# Patient Record
Sex: Female | Born: 1970 | Race: White | Hispanic: Yes | Marital: Single | Smoking: Never smoker
Health system: Southern US, Community
[De-identification: ages and names within clinical notes are randomized; demographics above are authoritative.]

## PROBLEM LIST (undated history)

## (undated) DIAGNOSIS — R748 Abnormal levels of other serum enzymes: Secondary | ICD-10-CM

## (undated) DIAGNOSIS — O24419 Gestational diabetes mellitus in pregnancy, unspecified control: Secondary | ICD-10-CM

## (undated) DIAGNOSIS — F32A Depression, unspecified: Secondary | ICD-10-CM

## (undated) DIAGNOSIS — F329 Major depressive disorder, single episode, unspecified: Secondary | ICD-10-CM

## (undated) HISTORY — DX: Abnormal levels of other serum enzymes: R74.8

---

## 2005-12-11 ENCOUNTER — Ambulatory Visit: Payer: Self-pay | Admitting: Internal Medicine

## 2005-12-12 ENCOUNTER — Ambulatory Visit: Payer: Self-pay | Admitting: *Deleted

## 2006-01-11 ENCOUNTER — Encounter: Payer: Self-pay | Admitting: Internal Medicine

## 2006-01-11 ENCOUNTER — Ambulatory Visit: Payer: Self-pay | Admitting: Internal Medicine

## 2007-01-24 ENCOUNTER — Inpatient Hospital Stay (HOSPITAL_COMMUNITY): Admission: AD | Admit: 2007-01-24 | Discharge: 2007-01-27 | Payer: Self-pay | Admitting: Obstetrics

## 2007-05-22 ENCOUNTER — Ambulatory Visit: Payer: Self-pay | Admitting: Internal Medicine

## 2007-05-22 LAB — CONVERTED CEMR LAB
Alkaline Phosphatase: 108 units/L (ref 39–117)
CO2: 22 meq/L (ref 19–32)
Cholesterol: 170 mg/dL (ref 0–200)
Creatinine, Ser: 0.48 mg/dL (ref 0.40–1.20)
Glucose, Bld: 97 mg/dL (ref 70–99)
LDL Cholesterol: 100 mg/dL — ABNORMAL HIGH (ref 0–99)
Sodium: 139 meq/L (ref 135–145)
Total Bilirubin: 0.5 mg/dL (ref 0.3–1.2)
Total CHOL/HDL Ratio: 4.4
Triglycerides: 154 mg/dL — ABNORMAL HIGH (ref <150)
VLDL: 31 mg/dL (ref 0–40)

## 2007-07-08 ENCOUNTER — Ambulatory Visit: Payer: Self-pay | Admitting: Internal Medicine

## 2007-08-21 ENCOUNTER — Encounter: Payer: Self-pay | Admitting: Family Medicine

## 2007-08-21 ENCOUNTER — Ambulatory Visit: Payer: Self-pay | Admitting: Internal Medicine

## 2007-08-21 LAB — CONVERTED CEMR LAB
Cholesterol: 157 mg/dL (ref 0–200)
HDL: 38 mg/dL — ABNORMAL LOW (ref >39)
Hep B E Ab: NEGATIVE
Total CHOL/HDL Ratio: 4.1
Triglycerides: 185 mg/dL — ABNORMAL HIGH (ref <150)
VLDL: 37 mg/dL (ref 0–40)

## 2007-09-25 ENCOUNTER — Ambulatory Visit: Payer: Self-pay | Admitting: Internal Medicine

## 2007-11-20 ENCOUNTER — Ambulatory Visit: Payer: Self-pay | Admitting: Family Medicine

## 2007-11-21 ENCOUNTER — Encounter: Payer: Self-pay | Admitting: Internal Medicine

## 2008-11-25 ENCOUNTER — Ambulatory Visit: Payer: Self-pay | Admitting: Internal Medicine

## 2008-11-25 ENCOUNTER — Encounter: Payer: Self-pay | Admitting: Family Medicine

## 2009-08-12 ENCOUNTER — Ambulatory Visit: Payer: Self-pay | Admitting: Internal Medicine

## 2009-08-12 LAB — CONVERTED CEMR LAB
Albumin: 4.5 g/dL (ref 3.5–5.2)
Alkaline Phosphatase: 81 units/L (ref 39–117)
CO2: 20 meq/L (ref 19–32)
Calcium: 9.2 mg/dL (ref 8.4–10.5)
Chloride: 103 meq/L (ref 96–112)
Glucose, Bld: 81 mg/dL (ref 70–99)
LDL Cholesterol: 92 mg/dL (ref 0–99)
Potassium: 4 meq/L (ref 3.5–5.3)
Sodium: 137 meq/L (ref 135–145)
Total Protein: 7.7 g/dL (ref 6.0–8.3)
Triglycerides: 136 mg/dL (ref <150)

## 2009-09-02 ENCOUNTER — Ambulatory Visit: Payer: Self-pay | Admitting: Internal Medicine

## 2009-10-05 ENCOUNTER — Ambulatory Visit: Payer: Self-pay | Admitting: Internal Medicine

## 2010-08-15 ENCOUNTER — Other Ambulatory Visit: Payer: Self-pay | Admitting: Family Medicine

## 2010-08-15 DIAGNOSIS — Z0374 Encounter for suspected problem with fetal growth ruled out: Secondary | ICD-10-CM

## 2010-08-15 DIAGNOSIS — Z1389 Encounter for screening for other disorder: Secondary | ICD-10-CM

## 2010-08-15 LAB — RUBELLA ANTIBODY, IGM: Rubella: IMMUNE

## 2010-08-15 LAB — ABO/RH: RH Type: POSITIVE

## 2010-08-15 LAB — HIV ANTIBODY (ROUTINE TESTING W REFLEX): HIV: NONREACTIVE

## 2010-08-18 ENCOUNTER — Ambulatory Visit (HOSPITAL_COMMUNITY)
Admission: RE | Admit: 2010-08-18 | Discharge: 2010-08-18 | Disposition: A | Discharge status: Home / Self Care | Payer: Medicaid Other | Source: Ambulatory Visit | Attending: Family Medicine | Admitting: Family Medicine

## 2010-08-18 DIAGNOSIS — O09529 Supervision of elderly multigravida, unspecified trimester: Secondary | ICD-10-CM | POA: Insufficient documentation

## 2010-08-18 DIAGNOSIS — Z363 Encounter for antenatal screening for malformations: Secondary | ICD-10-CM | POA: Insufficient documentation

## 2010-08-18 DIAGNOSIS — O358XX0 Maternal care for other (suspected) fetal abnormality and damage, not applicable or unspecified: Secondary | ICD-10-CM | POA: Insufficient documentation

## 2010-08-18 DIAGNOSIS — Z1389 Encounter for screening for other disorder: Secondary | ICD-10-CM | POA: Insufficient documentation

## 2010-08-18 DIAGNOSIS — Z0374 Encounter for suspected problem with fetal growth ruled out: Secondary | ICD-10-CM

## 2010-08-18 DIAGNOSIS — O0930 Supervision of pregnancy with insufficient antenatal care, unspecified trimester: Secondary | ICD-10-CM | POA: Insufficient documentation

## 2010-09-13 NOTE — Op Note (Signed)
Roberta Reed, Roberta Reed NO.:  [id number]   MEDICAL RECORD NO.:  [id number]          PATIENT TYPE:  INP   LOCATION:  9199                          FACILITY:  [facility]   PHYSICIAN:  Kathreen Cosier, M.D.DATE OF BIRTH:  04/17/1971   DATE OF PROCEDURE:  01/24/2007  DATE OF DISCHARGE:                               OPERATIVE REPORT   PREOPERATIVE DIAGNOSIS:  Intrauterine pregnancy at term, in early labor,  with a frank breech presentation.   SURGEON:  Dr. Gaynell Face.   ANESTHESIA:  Spinal.   PROCEDURE:  The patient placed on the operating table in the supine  position after spinal administered by Dr. Jean Rosenthal.  Transverse  suprapubic incision made, carried down through rectus fascia.  Fascia  cleaned and incised the length of the incision.  Recti muscles retracted  laterally.  Peritoneum incised longitudinally.  Transverse incision made  in the visceral peritoneum above the bladder.  Bladder mobilized  inferiorly.  Transverse lower uterine incision made.  The patient  delivered over frank breech female, Apgar 8 and 9, weighing 8 pounds 1  ounce.  The team was in attendance.  The fluid was clear.  The placenta  was fundal and removed manually and sent to labor and delivery.  Uterine  cavity cleaned with dry laps.  Uterine incision closed in 1 layer with  continuous suture of No. 1 chromic.  Bladder flap reattached with 2-0  chromic.  Uterus well contracted.  Tubes and ovaries normal.  Abdomen  closed in layers.  Peritoneum continuous suture of 0 chromic.  Fascia  continuous suture of 0 Dexon.  Skin closed with subcuticular stitch of 4-  0 Monocryl.  Blood loss 600 mL.  Patient tolerated the procedure well,  taken to recovery room in good condition.           ______________________________  Kathreen Cosier, M.D.     BAM/MEDQ  D:  01/24/2007  T:  01/25/2007  Job:  [id number]

## 2010-09-16 NOTE — Discharge Summary (Signed)
Roberta Reed, Roberta Reed NO.:  [id number]   MEDICAL RECORD NO.:  [id number]          PATIENT TYPE:  INP   LOCATION:  9148                          FACILITY:  [facility]   PHYSICIAN:  Kathreen Cosier, M.D.DATE OF BIRTH:  04/17/1971   DATE OF ADMISSION:  01/24/2007  DATE OF DISCHARGE:  01/27/2007                               DISCHARGE SUMMARY   Patient is a 41 year old primigravida, [facility] January 31, 2007, who was  admitted in early labor.  Cervix was 3 cm with a breech at -3 station  and she was delivered via low transverse cesarean section because of  breech presentation and labor.  She had a female, Apgars 8 and 9, weighing  8 pounds 1 ounce.  The placenta was sent to labor and delivery.  Postoperatively, she did well.  On admission, her hemoglobin was 13.4,  platelets 179.  Postoperatively, 10.2 hemoglobin, 154 platelets.  RPR  negative, HIV negative, urine negative.   She did well and was discharged on the third postoperative day,  ambulatory, on a regular diet, to see me in six weeks.   DISCHARGE DIAGNOSES:  Status post primary low transverse cesarean  section at term, because of breech presentation and labor.           ______________________________  Kathreen Cosier, M.D.     BAM/MEDQ  D:  02/20/2007  T:  02/20/2007  Job:  [id number]

## 2010-12-13 ENCOUNTER — Inpatient Hospital Stay (HOSPITAL_COMMUNITY)
Admission: AD | Admit: 2010-12-13 | Discharge: 2010-12-17 | DRG: 766 | Disposition: A | Discharge status: Home / Self Care | Payer: Medicaid Other | Source: Ambulatory Visit | Attending: Obstetrics & Gynecology | Admitting: Obstetrics & Gynecology

## 2010-12-13 ENCOUNTER — Encounter: Payer: Self-pay | Admitting: Family Medicine

## 2010-12-13 ENCOUNTER — Inpatient Hospital Stay (HOSPITAL_COMMUNITY): Payer: Medicaid Other

## 2010-12-13 DIAGNOSIS — O09529 Supervision of elderly multigravida, unspecified trimester: Secondary | ICD-10-CM | POA: Diagnosis present

## 2010-12-13 DIAGNOSIS — O34219 Maternal care for unspecified type scar from previous cesarean delivery: Secondary | ICD-10-CM | POA: Diagnosis present

## 2010-12-13 DIAGNOSIS — O288 Other abnormal findings on antenatal screening of mother: Secondary | ICD-10-CM

## 2010-12-13 DIAGNOSIS — Z98891 History of uterine scar from previous surgery: Secondary | ICD-10-CM

## 2010-12-13 DIAGNOSIS — O4100X0 Oligohydramnios, unspecified trimester, not applicable or unspecified: Principal | ICD-10-CM | POA: Diagnosis present

## 2010-12-13 HISTORY — DX: Gestational diabetes mellitus in pregnancy, unspecified control: O24.419

## 2010-12-13 HISTORY — DX: Major depressive disorder, single episode, unspecified: F32.9

## 2010-12-13 HISTORY — DX: Depression, unspecified: F32.A

## 2010-12-13 LAB — URINALYSIS, ROUTINE W REFLEX MICROSCOPIC
Bilirubin Urine: NEGATIVE
Leukocytes, UA: NEGATIVE
Nitrite: NEGATIVE
Specific Gravity, Urine: 1.01 (ref 1.005–1.030)
pH: 6 (ref 5.0–8.0)

## 2010-12-13 LAB — CBC
HCT: 38.7 % (ref 36.0–46.0)
Hemoglobin: 12.7 g/dL (ref 12.0–15.0)
MCH: 28.1 pg (ref 26.0–34.0)
MCHC: 33.1 g/dL (ref 30.0–36.0)
MCV: 85.1 fL (ref 78.0–100.0)
Platelets: 190 10*3/uL (ref 150–400)
RBC: 4.5 MIL/uL (ref 3.87–5.11)
RDW: 15.2 % (ref 11.5–15.5)

## 2010-12-13 LAB — COMPREHENSIVE METABOLIC PANEL
ALT: 26 U/L (ref 0–35)
Alkaline Phosphatase: 223 U/L — ABNORMAL HIGH (ref 39–117)
CO2: 18 mEq/L — ABNORMAL LOW (ref 19–32)
Glucose, Bld: 77 mg/dL (ref 70–99)
Potassium: 3.8 mEq/L (ref 3.5–5.1)
Sodium: 134 mEq/L — ABNORMAL LOW (ref 135–145)
Total Bilirubin: 0.2 mg/dL — ABNORMAL LOW (ref 0.3–1.2)

## 2010-12-13 LAB — PROTEIN / CREATININE RATIO, URINE: Protein Creatinine Ratio: 0.21 — ABNORMAL HIGH (ref 0.00–0.15)

## 2010-12-13 MED ORDER — ONDANSETRON HCL 4 MG/2ML IJ SOLN: 4.0000 mg | Freq: Four times a day (QID) | INTRAMUSCULAR | Status: DC | PRN

## 2010-12-13 MED ORDER — CITRIC ACID-SODIUM CITRATE 334-500 MG/5ML PO SOLN
30.0000 mL | ORAL | Status: DC | PRN
  Administered 2010-12-14: 30 mL via ORAL
  Filled 2010-12-13: qty 15

## 2010-12-13 MED ORDER — FLEET ENEMA 7-19 GM/118ML RE ENEM: 1.0000 | ENEMA | RECTAL | Status: DC | PRN

## 2010-12-13 MED ORDER — ACETAMINOPHEN 325 MG PO TABS: 650.0000 mg | ORAL_TABLET | ORAL | Status: DC | PRN

## 2010-12-13 MED ORDER — IBUPROFEN 600 MG PO TABS
600.0000 mg | ORAL_TABLET | Freq: Four times a day (QID) | ORAL | Status: DC | PRN
  Administered 2010-12-17: 600 mg via ORAL
  Filled 2010-12-13 (×9): qty 1

## 2010-12-13 MED ORDER — OXYTOCIN BOLUS FROM INFUSION
500.0000 mL | Freq: Once | INTRAVENOUS | Status: DC
  Filled 2010-12-13: qty 1000

## 2010-12-13 MED ORDER — OXYTOCIN 20 UNITS IN LACTATED RINGERS INFUSION - SIMPLE: 125.0000 mL/h | INTRAVENOUS | Status: AC

## 2010-12-13 MED ORDER — LIDOCAINE HCL (PF) 1 % IJ SOLN
30.0000 mL | INTRAMUSCULAR | Status: DC | PRN
  Filled 2010-12-13: qty 30

## 2010-12-13 MED ORDER — LACTATED RINGERS IV SOLN
500.0000 mL | INTRAVENOUS | Status: DC | PRN
  Administered 2010-12-14 (×2): 500 mL via INTRAVENOUS
  Administered 2010-12-14: 125 mL via INTRAVENOUS
  Administered 2010-12-14: 500 mL via INTRAVENOUS

## 2010-12-13 MED ORDER — OXYCODONE-ACETAMINOPHEN 5-325 MG PO TABS: 2.0000 | ORAL_TABLET | ORAL | Status: DC | PRN

## 2010-12-13 MED ORDER — LACTATED RINGERS IV SOLN
INTRAVENOUS | Status: DC
  Administered 2010-12-13: 125 mL/h via INTRAVENOUS
  Administered 2010-12-14 (×3): via INTRAVENOUS

## 2010-12-13 NOTE — ED Provider Notes (Addendum)
Chief Complaint:  Contractions   Roberta Reed is  40 y.o. G2P1001 at [redacted]w[redacted]d presents complaining of Contractions . Contractions: irregular, every 8-10 minutes Bleeding: minimal Membranes: intact Fetal Movement: active Headaches: None Visual Changes: None Edema: None Contractions started this AM at 6am.  Have been happening every 8-10 min since then.  Good fetal movement.  Some bloody show but no fluid.  Obstetrical/Gynecological History: OB History    Grav Para Term Preterm Abortions TAB SAB Ect Mult Living   2 1 1  0 0 0 0 0 0 1      Past Medical History: Past Medical History  Diagnosis Date  . Gestational diabetes     pt states that she stopped taking meds 2-38months after birth of first baby.  . Depression     postpartum    Past Surgical History: Past Surgical History  Procedure Date  . Cesarean section     breech -2008    Family History: No family history on file.  Social History: History  Substance Use Topics  . Smoking status: Not on file  . Smokeless tobacco: Not on file  . Alcohol Use: Not on file    Allergies: No Known Allergies  Prescriptions prior to admission  Medication Sig Dispense Refill  . prenatal vitamin w/FE, FA (PRENATAL 1 + 1) 27-1 MG TABS Take 1 tablet by mouth daily.          Review of Systems - Negative except as noted in HPI  Physical Exam   Blood pressure 135/82, pulse 81, temperature 98.9 F (37.2 C), temperature source Oral, resp. rate 18, height 4\' 8"  (1.422 m), weight 163 lb 6 oz (74.106 kg), last menstrual period 03/14/2010, SpO2 99.00%.  General: General appearance - alert, well appearing, and in no distress Extremities - peripheral pulses normal, no pedal edema, no clubbing or cyanosis, 2+ reflexes all extremities Baseline: 145 bpm, Variability: Good {> 6 bpm), Accelerations: Reactive and Decelerations: Variable: occasional irregular, every 8-10 minutes   Labs: No results found for this or any previous visit  (from the past 24 hour(s)). Imaging Studies:  No results found.   Assessment: Roberta Reed is  40 y.o. G2P1001 at [redacted]w[redacted]d presents with contractions and incidentally found elevated bp.  Plan: 1. Will watch for one hour and re-check cervix. 2. Obtain PIH labs and serial bp's 3. NST/monitoring due to variables  Roberta Reed 12/13/2010, 2:55 PM  BPP shows oligo.  Will admit and induce.  Case discussed with Dr. Orvan Falconer.

## 2010-12-13 NOTE — H&P (Signed)
Chief Complaint:  Contractions   Roberta Reed is  40 y.o. G2P1001 at [redacted]w[redacted]d presents complaining of Contractions . Contractions: irregular, every 8-10 minutes Bleeding: minimal Membranes: intact Fetal Movement: active Headaches: None Visual Changes: None Edema: None Contractions started this AM at 6am.  Have been happening every 8-10 min since then.  Good fetal movement.  Some bloody show but no fluid.  Obstetrical/Gynecological History: OB History    Grav Para Term Preterm Abortions TAB SAB Ect Mult Living   2 1 1  0 0 0 0 0 0 1      Past Medical History: Past Medical History  Diagnosis Date  . Gestational diabetes     pt states that she stopped taking meds 2-83months after birth of first baby.  . Depression     postpartum    Past Surgical History: Past Surgical History  Procedure Date  . Cesarean section     breech -2008    Family History: No family history on file.  Social History: History  Substance Use Topics  . Smoking status: Not on file  . Smokeless tobacco: Not on file  . Alcohol Use: Not on file    Allergies: No Known Allergies  Prescriptions prior to admission  Medication Sig Dispense Refill  . prenatal vitamin w/FE, FA (PRENATAL 1 + 1) 27-1 MG TABS Take 1 tablet by mouth daily.          Review of Systems - Negative except as noted in HPI  Physical Exam   Blood pressure 135/82, pulse 81, temperature 98.9 F (37.2 C), temperature source Oral, resp. rate 18, height 4\' 8"  (1.422 m), weight 163 lb 6 oz (74.106 kg), last menstrual period 03/14/2010, SpO2 99.00%.  General: General appearance - alert, well appearing, and in no distress Extremities - peripheral pulses normal, no pedal edema, no clubbing or cyanosis, 2+ reflexes all extremities Baseline: 145 bpm, Variability: Good {> 6 bpm), Accelerations: Reactive and Decelerations: Variable: occasional irregular, every 8-10 minutes   Labs:  Prenatal labs: ABO, Rh: O/Positive/-- (04/16  0000) Antibody: Negative (04/16 0000) Rubella: Imm   RPR: NR    HBsAg: Negative (04/16 0000)  HIV: Non-reactive (04/16 0000)  GBS: Negative (07/27 0000)   1 hr Glucola: Failed, passed 3hr Genetic screening: Not performed Anatomy US: Normal   Imaging Studies:  BPP 6/10.   Assessment: Roberta Reed is  40 y.o. G2P1001 at [redacted]w[redacted]d presents with contractions and incidentally found elevated bp.  Plan: 1. Will watch for one hour and re-check cervix. 2. Obtain PIH labs and serial bp's 3. NST/monitoring due to variables  Roberta Reed 12/13/2010, 2:55 PM  BPP shows oligo.  Will admit and induce.  Will likely attempt to place foley with speculum guidance.  No cytotec due to VBAC.  Case discussed with Dr. Orvan Falconer.

## 2010-12-13 NOTE — Progress Notes (Signed)
Roberta Reed is a 40 y.o. G2P1001 at [redacted]w[redacted]d admitted for induction of labor due to oligo.  Subjective: Pt ambulating, foley bulb in place  Objective: BP 119/60  Pulse 61  Temp(Src) 98.9 F (37.2 C) (Oral)  Resp 20  Ht 4\' 8"  (1.422 m)  Wt 163 lb 6 oz (74.106 kg)  BMI 36.63 kg/m2  SpO2 99%  LMP 03/14/2010 I/O last 3 completed shifts: In: 625.8 [P.O.:480; I.V.:145.8] Out: -     FHT:  Intermittent monitoring, FHR 150 UC:   irregular SVE:   Dilation: Fingertip Effacement (%): 30 Station:  (too high per dr Louanne Belton) Exam by:: Dr Louanne Belton Foley bulb in place  Labs: Lab Results  Component Value Date   WBC 8.6 12/13/2010   HGB 12.8 12/13/2010   HCT 38.7 12/13/2010   MCV 85.1 12/13/2010   PLT 190 12/13/2010    Assessment / Plan: Induction of labor 2/2 oligo  Labor: Progressing normally Fetal Wellbeing:  Category I Pain Control:  Labor support without medications I/D:  n/a Anticipated MOD:  NSVD  Lindaann Slough MD 12/13/2010, 11:30 PM

## 2010-12-13 NOTE — Progress Notes (Signed)
Foley catheter placed through cervical os under visual guidance with ring forceps and speculum.  Catheter inflated with 60cc fluid.  Was confirmed in place both visually and with digital exam.  Pt tolerated procedure well.  Interpreter was present in the room throughout the procedure and all questions were answered.  Roberta Reed 12/13/2010, 6:36 PM

## 2010-12-13 NOTE — Progress Notes (Signed)
Patient is here with c/o intermittent contractions since 8am. She reports small vaginal bleeding this morning, denies lof. She reports good fetal movement. Denies headache, visual disturbance.

## 2010-12-13 NOTE — Progress Notes (Signed)
Roberta Reed is a 40 y.o. G2P1001 at [redacted]w[redacted]d admitted for in duction of labor 2/2 oligo and elevated BP  Subjective: Pt comfortable with foley bulb in place  Objective: BP 127/77  Pulse 65  Temp(Src) 98.9 F (37.2 C) (Oral)  Resp 20  Ht 4\' 8"  (1.422 m)  Wt 163 lb 6 oz (74.106 kg)  BMI 36.63 kg/m2  SpO2 99%  LMP 03/14/2010 I/O last 3 completed shifts: In: 625.8 [P.O.:480; I.V.:145.8] Out: -     FHT:  FHR: 150 bpm, variability: moderate,  accelerations:  Present,  decelerations:  Present variables UC:   regular, every 4-6 minutes SVE:   Dilation: Fingertip Effacement (%): 30 Station:  (too high per dr Louanne Belton) Exam by:: Dr Louanne Belton foley bulb in place  Labs: Lab Results  Component Value Date   WBC 8.6 12/13/2010   HGB 12.8 12/13/2010   HCT 38.7 12/13/2010   MCV 85.1 12/13/2010   PLT 190 12/13/2010    Assessment / Plan: Induction of labor 2/2 oligo  Labor: foley bulb in place Fetal Wellbeing:  Category II Pain Control:  Labor support without medications I/D:  n/a Anticipated MOD:  NSVD  Roberta Reed S. 12/13/2010, 9:14 PM

## 2010-12-14 ENCOUNTER — Inpatient Hospital Stay (HOSPITAL_COMMUNITY): Payer: Medicaid Other | Admitting: Anesthesiology

## 2010-12-14 ENCOUNTER — Encounter (HOSPITAL_COMMUNITY): Payer: Self-pay | Admitting: Anesthesiology

## 2010-12-14 ENCOUNTER — Encounter (HOSPITAL_COMMUNITY): Payer: Self-pay | Admitting: Registered Nurse

## 2010-12-14 ENCOUNTER — Inpatient Hospital Stay (HOSPITAL_COMMUNITY): Payer: Medicaid Other | Admitting: Registered Nurse

## 2010-12-14 ENCOUNTER — Encounter (HOSPITAL_COMMUNITY)
Admission: AD | Disposition: A | Discharge status: Home / Self Care | Payer: Self-pay | Source: Ambulatory Visit | Attending: Obstetrics & Gynecology

## 2010-12-14 ENCOUNTER — Encounter (HOSPITAL_COMMUNITY): Payer: Self-pay

## 2010-12-14 DIAGNOSIS — O4100X0 Oligohydramnios, unspecified trimester, not applicable or unspecified: Secondary | ICD-10-CM

## 2010-12-14 DIAGNOSIS — O09529 Supervision of elderly multigravida, unspecified trimester: Secondary | ICD-10-CM

## 2010-12-14 DIAGNOSIS — O34219 Maternal care for unspecified type scar from previous cesarean delivery: Secondary | ICD-10-CM

## 2010-12-14 LAB — ABO/RH: ABO/RH(D): O POS

## 2010-12-14 SURGERY — Surgical Case
Anesthesia: Epidural | Wound class: Clean Contaminated

## 2010-12-14 MED ORDER — DIPHENHYDRAMINE HCL 50 MG/ML IJ SOLN: 12.5000 mg | INTRAMUSCULAR | Status: DC | PRN

## 2010-12-14 MED ORDER — DIBUCAINE 1 % RE OINT: 1.0000 "application " | TOPICAL_OINTMENT | RECTAL | Status: DC | PRN

## 2010-12-14 MED ORDER — DIPHENHYDRAMINE HCL 25 MG PO CAPS: 25.0000 mg | ORAL_CAPSULE | Freq: Four times a day (QID) | ORAL | Status: DC | PRN

## 2010-12-14 MED ORDER — KETOROLAC TROMETHAMINE 30 MG/ML IJ SOLN
INTRAMUSCULAR | Status: AC
  Administered 2010-12-14: 30 mg via INTRAVENOUS
  Filled 2010-12-14: qty 1

## 2010-12-14 MED ORDER — OXYTOCIN 10 UNIT/ML IJ SOLN
INTRAMUSCULAR | Status: DC | PRN
  Administered 2010-12-14 (×2): 20 [IU] via INTRAMUSCULAR

## 2010-12-14 MED ORDER — MORPHINE SULFATE (PF) 0.5 MG/ML IJ SOLN
INTRAMUSCULAR | Status: DC | PRN
  Administered 2010-12-14: 2 mg via INTRAVENOUS
  Administered 2010-12-14: 3 mg via EPIDURAL

## 2010-12-14 MED ORDER — KETOROLAC TROMETHAMINE 30 MG/ML IJ SOLN
15.0000 mg | Freq: Once | INTRAMUSCULAR | Status: AC | PRN
  Administered 2010-12-14: 30 mg via INTRAVENOUS

## 2010-12-14 MED ORDER — FENTANYL 2.5 MCG/ML BUPIVACAINE 1/10 % EPIDURAL INFUSION (WH - ANES)
14.0000 mL/h | INTRAMUSCULAR | Status: DC
  Administered 2010-12-14: 14 mL/h via EPIDURAL
  Administered 2010-12-14: 11 mL/h via EPIDURAL
  Filled 2010-12-14 (×2): qty 60

## 2010-12-14 MED ORDER — CEFAZOLIN SODIUM 1-5 GM-% IV SOLN
INTRAVENOUS | Status: DC | PRN
  Administered 2010-12-14: 1 g via INTRAVENOUS

## 2010-12-14 MED ORDER — NALBUPHINE SYRINGE 5 MG/0.5 ML
5.0000 mg | INJECTION | INTRAMUSCULAR | Status: DC | PRN
Start: 2010-12-14 — End: 2010-12-17
  Filled 2010-12-14: qty 1

## 2010-12-14 MED ORDER — OXYTOCIN 20 UNITS IN LACTATED RINGERS INFUSION - SIMPLE: 125.0000 mL/h | INTRAVENOUS | Status: AC

## 2010-12-14 MED ORDER — KETOROLAC TROMETHAMINE 30 MG/ML IJ SOLN
30.0000 mg | Freq: Four times a day (QID) | INTRAMUSCULAR | Status: AC | PRN
  Filled 2010-12-14 (×2): qty 1

## 2010-12-14 MED ORDER — SODIUM BICARBONATE 8.4 % IV SOLN
INTRAVENOUS | Status: AC
  Filled 2010-12-14: qty 50

## 2010-12-14 MED ORDER — EPHEDRINE 5 MG/ML INJ
10.0000 mg | INTRAVENOUS | Status: DC | PRN
  Filled 2010-12-14: qty 4

## 2010-12-14 MED ORDER — TERBUTALINE SULFATE 1 MG/ML IJ SOLN: 0.2500 mg | Freq: Once | INTRAMUSCULAR | Status: DC | PRN

## 2010-12-14 MED ORDER — LACTATED RINGERS IV SOLN: INTRAVENOUS | Status: DC

## 2010-12-14 MED ORDER — IBUPROFEN 600 MG PO TABS: 600.0000 mg | ORAL_TABLET | Freq: Four times a day (QID) | ORAL | Status: DC | PRN

## 2010-12-14 MED ORDER — SODIUM BICARBONATE 8.4 % IV SOLN
INTRAVENOUS | Status: DC | PRN
  Administered 2010-12-14: 5 mL via EPIDURAL

## 2010-12-14 MED ORDER — NALBUPHINE SYRINGE 5 MG/0.5 ML
10.0000 mg | INJECTION | INTRAMUSCULAR | Status: DC | PRN
  Filled 2010-12-14: qty 1

## 2010-12-14 MED ORDER — PHENYLEPHRINE 40 MCG/ML (10ML) SYRINGE FOR IV PUSH (FOR BLOOD PRESSURE SUPPORT)
PREFILLED_SYRINGE | INTRAVENOUS | Status: AC
  Filled 2010-12-14: qty 5

## 2010-12-14 MED ORDER — SCOPOLAMINE 1 MG/3DAYS TD PT72
MEDICATED_PATCH | TRANSDERMAL | Status: AC
  Filled 2010-12-14: qty 1

## 2010-12-14 MED ORDER — LACTATED RINGERS IV SOLN
INTRAVENOUS | Status: DC
  Administered 2010-12-14: 300 mL via INTRAUTERINE

## 2010-12-14 MED ORDER — OXYTOCIN 10 UNIT/ML IJ SOLN
INTRAMUSCULAR | Status: AC
  Filled 2010-12-14: qty 4

## 2010-12-14 MED ORDER — WITCH HAZEL-GLYCERIN EX PADS: 1.0000 "application " | MEDICATED_PAD | CUTANEOUS | Status: DC | PRN

## 2010-12-14 MED ORDER — KETOROLAC TROMETHAMINE 30 MG/ML IJ SOLN: 30.0000 mg | Freq: Four times a day (QID) | INTRAMUSCULAR | Status: AC | PRN

## 2010-12-14 MED ORDER — ONDANSETRON HCL 4 MG PO TABS: 4.0000 mg | ORAL_TABLET | ORAL | Status: DC | PRN

## 2010-12-14 MED ORDER — TETANUS-DIPHTH-ACELL PERTUSSIS 5-2.5-18.5 LF-MCG/0.5 IM SUSP
0.5000 mL | Freq: Once | INTRAMUSCULAR | Status: AC
  Administered 2010-12-16: 0.5 mL via INTRAMUSCULAR
  Filled 2010-12-14: qty 0.5

## 2010-12-14 MED ORDER — NALBUPHINE SYRINGE 5 MG/0.5 ML
5.0000 mg | INJECTION | INTRAMUSCULAR | Status: DC | PRN
  Filled 2010-12-14: qty 1

## 2010-12-14 MED ORDER — ONDANSETRON HCL 4 MG/2ML IJ SOLN
INTRAMUSCULAR | Status: AC
  Filled 2010-12-14: qty 2

## 2010-12-14 MED ORDER — OXYCODONE-ACETAMINOPHEN 5-325 MG PO TABS
1.0000 | ORAL_TABLET | ORAL | Status: DC | PRN
  Administered 2010-12-16: 1 via ORAL
  Filled 2010-12-14 (×2): qty 1

## 2010-12-14 MED ORDER — MEPERIDINE HCL 25 MG/ML IJ SOLN: 6.2500 mg | INTRAMUSCULAR | Status: DC | PRN

## 2010-12-14 MED ORDER — PHENYLEPHRINE 40 MCG/ML (10ML) SYRINGE FOR IV PUSH (FOR BLOOD PRESSURE SUPPORT)
80.0000 ug | PREFILLED_SYRINGE | INTRAVENOUS | Status: DC | PRN
  Filled 2010-12-14 (×2): qty 5

## 2010-12-14 MED ORDER — DIPHENHYDRAMINE HCL 50 MG/ML IJ SOLN: 25.0000 mg | INTRAMUSCULAR | Status: DC | PRN

## 2010-12-14 MED ORDER — CEFAZOLIN SODIUM 1-5 GM-% IV SOLN
1.0000 g | Freq: Three times a day (TID) | INTRAVENOUS | Status: DC
  Filled 2010-12-14: qty 50

## 2010-12-14 MED ORDER — FENTANYL CITRATE 0.05 MG/ML IJ SOLN
INTRAMUSCULAR | Status: AC
  Administered 2010-12-14: 25 ug via INTRAVENOUS
  Filled 2010-12-14: qty 2

## 2010-12-14 MED ORDER — PROMETHAZINE HCL 25 MG/ML IJ SOLN: 6.2500 mg | INTRAMUSCULAR | Status: DC | PRN

## 2010-12-14 MED ORDER — ACETAMINOPHEN 10 MG/ML IV SOLN
1000.0000 mg | Freq: Four times a day (QID) | INTRAVENOUS | Status: AC | PRN
  Filled 2010-12-14: qty 100

## 2010-12-14 MED ORDER — SCOPOLAMINE 1 MG/3DAYS TD PT72
1.0000 | MEDICATED_PATCH | Freq: Once | TRANSDERMAL | Status: AC
  Administered 2010-12-14: 1.5 mg via TRANSDERMAL

## 2010-12-14 MED ORDER — MEASLES, MUMPS & RUBELLA VAC ~~LOC~~ INJ
0.5000 mL | INJECTION | Freq: Once | SUBCUTANEOUS | Status: DC
  Filled 2010-12-14: qty 0.5

## 2010-12-14 MED ORDER — LACTATED RINGERS IV SOLN
500.0000 mL | Freq: Once | INTRAVENOUS | Status: AC
  Administered 2010-12-14: 500 mL via INTRAVENOUS

## 2010-12-14 MED ORDER — LANOLIN HYDROUS EX OINT: 1.0000 "application " | TOPICAL_OINTMENT | CUTANEOUS | Status: DC | PRN

## 2010-12-14 MED ORDER — ONDANSETRON HCL 4 MG/2ML IJ SOLN: 4.0000 mg | INTRAMUSCULAR | Status: DC | PRN

## 2010-12-14 MED ORDER — FENTANYL CITRATE 0.05 MG/ML IJ SOLN
25.0000 ug | INTRAMUSCULAR | Status: DC | PRN
  Administered 2010-12-14: 25 ug via INTRAVENOUS

## 2010-12-14 MED ORDER — ACETAMINOPHEN 325 MG PO TABS: 325.0000 mg | ORAL_TABLET | ORAL | Status: DC | PRN

## 2010-12-14 MED ORDER — SODIUM CHLORIDE 0.9 % IJ SOLN: 3.0000 mL | INTRAMUSCULAR | Status: DC | PRN

## 2010-12-14 MED ORDER — SODIUM CHLORIDE 0.9 % IV SOLN
1.0000 ug/kg/h | INTRAVENOUS | Status: DC | PRN
  Filled 2010-12-14: qty 2.5

## 2010-12-14 MED ORDER — LACTATED RINGERS IV SOLN
INTRAVENOUS | Status: DC
  Administered 2010-12-14 (×2): via INTRAUTERINE

## 2010-12-14 MED ORDER — LIDOCAINE-EPINEPHRINE (PF) 2 %-1:200000 IJ SOLN
INTRAMUSCULAR | Status: AC
  Filled 2010-12-14: qty 20

## 2010-12-14 MED ORDER — CEFAZOLIN SODIUM 1-5 GM-% IV SOLN
INTRAVENOUS | Status: AC
  Filled 2010-12-14: qty 50

## 2010-12-14 MED ORDER — NALOXONE HCL 0.4 MG/ML IJ SOLN: 0.4000 mg | INTRAMUSCULAR | Status: DC | PRN

## 2010-12-14 MED ORDER — MENTHOL 3 MG MT LOZG: 1.0000 | LOZENGE | OROMUCOSAL | Status: DC | PRN

## 2010-12-14 MED ORDER — MORPHINE SULFATE 0.5 MG/ML IJ SOLN
INTRAMUSCULAR | Status: AC
  Filled 2010-12-14: qty 10

## 2010-12-14 MED ORDER — SIMETHICONE 80 MG PO CHEW
80.0000 mg | CHEWABLE_TABLET | ORAL | Status: DC | PRN
  Administered 2010-12-14 – 2010-12-16 (×2): 80 mg via ORAL

## 2010-12-14 MED ORDER — EPHEDRINE 5 MG/ML INJ
10.0000 mg | INTRAVENOUS | Status: DC | PRN
  Filled 2010-12-14 (×2): qty 4

## 2010-12-14 MED ORDER — PRENATAL PLUS 27-1 MG PO TABS
1.0000 | ORAL_TABLET | Freq: Every day | ORAL | Status: DC
  Administered 2010-12-14 – 2010-12-16 (×3): 1 via ORAL
  Filled 2010-12-14 (×3): qty 1

## 2010-12-14 MED ORDER — METOCLOPRAMIDE HCL 5 MG/ML IJ SOLN: 10.0000 mg | Freq: Three times a day (TID) | INTRAMUSCULAR | Status: DC | PRN

## 2010-12-14 MED ORDER — NON FORMULARY
Status: DC | PRN
  Administered 2010-12-14: 50 mL

## 2010-12-14 MED ORDER — NALBUPHINE SYRINGE 5 MG/0.5 ML
INJECTION | INTRAMUSCULAR | Status: AC
  Filled 2010-12-14: qty 1

## 2010-12-14 MED ORDER — OXYTOCIN 20 UNITS IN LACTATED RINGERS INFUSION - SIMPLE
1.0000 m[IU]/min | INTRAVENOUS | Status: DC
  Administered 2010-12-14: 1 m[IU]/min via INTRAVENOUS

## 2010-12-14 MED ORDER — PHENYLEPHRINE 40 MCG/ML (10ML) SYRINGE FOR IV PUSH (FOR BLOOD PRESSURE SUPPORT)
80.0000 ug | PREFILLED_SYRINGE | INTRAVENOUS | Status: DC | PRN
  Filled 2010-12-14: qty 5

## 2010-12-14 MED ORDER — ONDANSETRON HCL 4 MG/2ML IJ SOLN
INTRAMUSCULAR | Status: DC | PRN
  Administered 2010-12-14: 4 mg via INTRAVENOUS

## 2010-12-14 MED ORDER — IBUPROFEN 600 MG PO TABS
600.0000 mg | ORAL_TABLET | Freq: Four times a day (QID) | ORAL | Status: DC
  Administered 2010-12-15 – 2010-12-17 (×9): 600 mg via ORAL
  Filled 2010-12-14: qty 1

## 2010-12-14 MED ORDER — DIPHENHYDRAMINE HCL 25 MG PO CAPS: 25.0000 mg | ORAL_CAPSULE | ORAL | Status: DC | PRN

## 2010-12-14 MED ORDER — ONDANSETRON HCL 4 MG/2ML IJ SOLN: 4.0000 mg | Freq: Three times a day (TID) | INTRAMUSCULAR | Status: DC | PRN

## 2010-12-14 MED ORDER — PHENYLEPHRINE HCL 10 MG/ML IJ SOLN
INTRAMUSCULAR | Status: DC | PRN
  Administered 2010-12-14: 80 ug via INTRAVENOUS

## 2010-12-14 SURGICAL SUPPLY — 27 items
CHLORAPREP W/TINT 26ML (MISCELLANEOUS) ×2 IMPLANT
CLOTH BEACON ORANGE TIMEOUT ST (SAFETY) ×2 IMPLANT
CONTAINER PREFILL 10% NBF 15ML (MISCELLANEOUS) IMPLANT
DRAIN JACKSON PRT FLT 7MM (DRAIN) IMPLANT
DRESSING TELFA 8X3 (GAUZE/BANDAGES/DRESSINGS) IMPLANT
ELECT REM PT RETURN 9FT ADLT (ELECTROSURGICAL) ×2
ELECTRODE REM PT RTRN 9FT ADLT (ELECTROSURGICAL) ×1 IMPLANT
EVACUATOR SILICONE 100CC (DRAIN) IMPLANT
EXTRACTOR VACUUM M CUP 4 TUBE (SUCTIONS) IMPLANT
GAUZE SPONGE 4X4 12PLY STRL LF (GAUZE/BANDAGES/DRESSINGS) ×4 IMPLANT
GLOVE BIO SURGEON STRL SZ7 (GLOVE) ×2 IMPLANT
GLOVE BIOGEL PI IND STRL 7.0 (GLOVE) ×2 IMPLANT
GLOVE BIOGEL PI INDICATOR 7.0 (GLOVE) ×2
GOWN PREVENTION PLUS LG XLONG (DISPOSABLE) ×6 IMPLANT
KIT ABG SYR 3ML LUER SLIP (SYRINGE) IMPLANT
NEEDLE HYPO 25X5/8 SAFETYGLIDE (NEEDLE) ×2 IMPLANT
NS IRRIG 1000ML POUR BTL (IV SOLUTION) ×2 IMPLANT
PACK C SECTION WH (CUSTOM PROCEDURE TRAY) ×2 IMPLANT
PAD ABD 7.5X8 STRL (GAUZE/BANDAGES/DRESSINGS) IMPLANT
SLEEVE SCD COMPRESS KNEE MED (MISCELLANEOUS) IMPLANT
STAPLER VISISTAT 35W (STAPLE) IMPLANT
SUT VIC AB 0 CTX 36 (SUTURE) ×5
SUT VIC AB 0 CTX36XBRD ANBCTRL (SUTURE) ×5 IMPLANT
SUT VIC AB 4-0 KS 27 (SUTURE) IMPLANT
TOWEL OR 17X24 6PK STRL BLUE (TOWEL DISPOSABLE) ×4 IMPLANT
TRAY FOLEY CATH 14FR (SET/KITS/TRAYS/PACK) ×2 IMPLANT
WATER STERILE IRR 1000ML POUR (IV SOLUTION) ×2 IMPLANT

## 2010-12-14 NOTE — Progress Notes (Signed)
RN assuming care. Report from [place], [place]

## 2010-12-14 NOTE — Anesthesia Procedure Notes (Addendum)
Epidural Patient location during procedure: OB  Staffing Anesthesiologist: Jiles Garter  Preanesthetic Checklist Completed: patient identified, site marked, surgical consent, pre-op evaluation, timeout performed, IV checked, risks and benefits discussed and monitors and equipment checked  Epidural Patient position: sitting Prep: site prepped and draped and DuraPrep Patient monitoring: continuous pulse ox and blood pressure Approach: midline Injection technique: LOR air  Needle:  Needle type: Tuohy  Needle gauge: 17 G Needle length: 9 cm Needle insertion depth: 5 cm cm Catheter type: closed end flexible Catheter size: 19 Gauge Catheter at skin depth: 10 cm Test dose: negative  Assessment Events: blood not aspirated, injection not painful, no injection resistance, negative IV test and no paresthesia  Additional Notes Dosing of Epidural: 1st dose, Through needle...Marland KitchenMarland KitchenMarland Kitchen 4 mg Marcaine 2nd dose, through catheter.... epi 1:200K + Xylocaine 40 mg 3rd dose, through catheter...Marland KitchenMarland Kitchenepi 1:200K + Xylocaine 40 mg Each dose occurred after waiting 3 min,patient was free of IV sx; and patient exhibits no evidence of SA injection  Patient is more comfortable after epidural dosed. Please see RN's note for documentation of vital signs,and FHR which are stable.

## 2010-12-14 NOTE — Op Note (Signed)
Roberta Reed PROCEDURE DATE: 12/14/2010  PREOPERATIVE DIAGNOSIS: Intrauterine pregnancy at  [redacted]w[redacted]d weeks gestation; non-reassuring fetal status and failed VBAC  POSTOPERATIVE DIAGNOSIS: The same  PROCEDURE:    Low Transverse Cesarean Section  SURGEON:  Dr. Elsie Lincoln  ASSISTANT: Dr. Maryelizabeth Kaufmann  INDICATIONS: Roberta Reed is a 40 y.o. [phone] at [redacted]w[redacted]d scheduled for cesarean section secondary to non-reassuring fetal status.  The risks of cesarean section discussed with the patient included but were not limited to: bleeding which may require transfusion or reoperation; infection which may require antibiotics; injury to bowel, bladder, ureters or other surrounding organs; injury to the fetus; need for additional procedures including hysterectomy in the event of a life-threatening hemorrhage; placental abnormalities wth subsequent pregnancies, incisional problems, thromboembolic phenomenon and other postoperative/anesthesia complications. The patient concurred with the proposed plan, giving informed written consent for the procedure.    FINDINGS:  Viable female infant in cephalic LOA presentation.  nursery-stable  APGAR: 7 at one minute and 9 at 5 minutes,   Meconium stained amniotic fluid.  Intact placenta, three vessel cord.  Normal uterus grossly.   Moderate adhesions involving bladder and anterior abdominal wall.  ANESTHESIA:    Epidural ESTIMATED BLOOD LOSS: 600 ml SPECIMENS: Placenta sent to L&D COMPLICATIONS: None immediate  PROCEDURE IN DETAIL:  The patient received intravenous antibiotics and had sequential compression devices applied to her lower extremities while in the preoperative area.  She was then taken to the operating room where spinal anesthesia was administered (epidural anesthesia was dosed up to surgical level) and was found to be adequate. She was then placed in a dorsal supine position with a leftward tilt, and prepped and draped in a sterile  manner.  A foley catheter was placed into her bladder and attached to constant gravity.  After an adequate timeout was performed, a Pfannenstiel skin incision was made with scalpel and carried through to the underlying layer of fascia. The fascia was incised in the midline and this incision was extended bilaterally using the Mayo scissors. Kocher clamps were applied to the superior aspect of the fascial incision and the underlying rectus muscles were dissected off bluntly. A similar process was carried out on the inferior aspect of the facial incision. The rectus muscles were separated in the midline bluntly and the peritoneum was entered bluntly. Attention was turned to the lower uterine segment where a bladder flap was created.  There were extensive adhesions involving this area.   A transverse hysterotomy was made with a scalpel and extended bilaterally bluntly. The bladder blade was then removed. The infant was successfully delivered, and cord was clamped and cut and infant was handed over to awaiting neonatology team. Uterine massage was then administered and the placenta delivered intact with three-vessel cord. The uterus was then exteriorized and cleared of clot and debris.  The hysterotomy was closed with 0 vicryl.  A second imbricating suture of 0-Vicryl was used to reinforce the incision and aid in hemostasis.  The bladder was retrograde filled with sterile milk due to the extensive lysis of adhesions involving the bladder.  There was no extravasation of milk.  The bladder was intact.  The peritoneum and rectus muscles were noted to be hemostatic.  The fascia was closed with 0-Vicryl in a running fashion with good restoration of anatomy.  The subcutaneus tissue was copiously irrigated.  The skin was closed with 4-0 Vicryl in a subcuticular fashion.  Pt tolerated the procedure will.  All counts were correct x2.  Pt went to the recovery room in stable condition.  Roberta Osoria H. 1:03 PM

## 2010-12-14 NOTE — Anesthesia Postprocedure Evaluation (Signed)
  Anesthesia Post Note  Patient: Roberta Reed  Procedure(s) Performed:  CESAREAN SECTION  Anesthesia type: Spinal  Patient location: PACU  Post pain: Pain level controlled  Post assessment: Post-op Vital signs reviewed  Last Vitals:  Filed Vitals:   12/14/10 1330  BP:   Pulse: 60  Temp:   Resp: 18    Post vital signs: Reviewed  Level of consciousness: awake  Complications: No apparent anesthesia complications

## 2010-12-14 NOTE — Progress Notes (Signed)
°  Roberta Reed is a 40 y.o. G2P1001 at [redacted]w[redacted]d by LMP admitted for induction of labor due to Low amniotic fluid..  Subjective:   Objective: BP 118/68   Pulse 60   Temp(Src) 99 F (37.2 C) (Oral)   Resp 18   Ht 4\' 8"  (1.422 m)   Wt 74.106 kg (163 lb 6 oz)   BMI 36.63 kg/m2   SpO2 100%   LMP 03/14/2010 I/O last 3 completed shifts: In: 625.8 [P.O.:480; I.V.:145.8] Out: -     FHT:  FHR: 145 bpm, variability: moderate,  accelerations:  Present,  decelerations:  Present having earlies with most contractions. UC:   regular, every 6 minutes SVE:   Dilation: 7 Effacement (%): 80 Station: -1 Exam by:: cwicker,rnc  Labs: Lab Results  Component Value Date   WBC 8.6 12/13/2010   HGB 12.8 12/13/2010   HCT 38.7 12/13/2010   MCV 85.1 12/13/2010   PLT 190 12/13/2010    Assessment / Plan: IOL for oligo.   Labor: Cervical change has stalled, AROM, IUPC placed, will begin pit and titrate as tolerated. Fetal Wellbeing:  Category II and light mec Pain Control:  Epidural I/D:  n/a Anticipated MOD:  NSVD  Katie Durland 12/14/2010, 9:21 AM

## 2010-12-14 NOTE — Progress Notes (Signed)
Roberta Reed is a 40 y.o. G2P1001 at [redacted]w[redacted]d TOLAC induced for oligo  Subjective:   Objective: BP 137/75  Pulse 69  Temp(Src) 98.7 F (37.1 C) (Axillary)  Resp 18  Ht 4\' 8"  (1.422 m)  Wt 74.106 kg (163 lb 6 oz)  BMI 36.63 kg/m2  SpO2 100%  LMP 03/14/2010 I/O last 3 completed shifts: In: 625.8 [P.O.:480; I.V.:145.8] Out: -     FHT:  FHR: 150 bpm, variability: min to mod,  accelerations:  Present,  decelerations:  Present lates while on Pitocin UC:   regular, every 2-4 minutes SVE:   Dilation: 6.5 Effacement (%): 80 Station: -3 Exam by:: dr ritch/m williams,cnm  Labs: Lab Results  Component Value Date   WBC 8.6 12/13/2010   HGB 12.8 12/13/2010   HCT 38.7 12/13/2010   MCV 85.1 12/13/2010   PLT 190 12/13/2010    Assessment / Plan: Tolac with fetal intolerance to ptiocin.  Pt has been 6 cm since 4 am.  Periods of adequacy noted with no cervical change.  Head out of the pelvis. Pt consented for c/s.  Risks include but not limited to bleeding, infection, damage to intraabdominal organs, hysterectomy, DVT/PE.  Roberta Reed H. 12/14/2010, 11:31 AM

## 2010-12-14 NOTE — Progress Notes (Signed)
I have seen and evaluated the patient and agree with the above. [name] 12/14/2010,10:09 AM

## 2010-12-14 NOTE — Transfer of Care (Signed)
Immediate Anesthesia Transfer of Care Note  Patient: Roberta Reed  Procedure(s) Performed:  CESAREAN SECTION  Patient Location: PACU  Anesthesia Type: Regional and Epidural  Level of Consciousness: awake, alert  and oriented  Airway & Oxygen Therapy: Patient Spontanous Breathing  Post-op Assessment: Report given to PACU RN and Post -op Vital signs reviewed and stable  Post vital signs: Reviewed and stable  Complications: No apparent anesthesia complications

## 2010-12-14 NOTE — Progress Notes (Signed)
Called to Room to visualize FHR tracing with Late decels. Pt comfortable with UCs. FHR baseline 145 with good baseline variability. However, there are repetetive late decels with each contraction. No accels. UCs are each about 60+ strength, about 180 mvu/10 min. Pitocin turned off. Pt turned to exaggerated Sims. Discussed need for observation after consult with Dr Penne Lash we may need to discuss Cesarean delivery Dr Penne Lash notified and will come see patient.  Interpretor present for exam and discussion.

## 2010-12-14 NOTE — Progress Notes (Signed)
Roberta Reed is a 40 y.o. G2P1001 at [redacted]w[redacted]d, admitted for IOL secondary to oligohydramnios  Subjective: Doing well, no complaints  Objective: BP 108/71  Pulse 63  Temp(Src) 99 F (37.2 C) (Oral)  Resp 18  Ht 4\' 8"  (1.422 m)  Wt 163 lb 6 oz (74.106 kg)  BMI 36.63 kg/m2  SpO2 100%  LMP 03/14/2010  Fetal Heart Rate: 140 Variability: moderate Accelerations: present Decelerations: early decels present with most contractions  Contractions: Q46min  SVE:   Dilation: 6.5 Effacement (%): 80 Station: 0 Exam by:: Young, MD  Pitocin: NA Mag: NA   Assessment / Plan: 40 y.o. G2P1001 at [redacted]w[redacted]d here for IOL  Labor: Slowly progressing.  Will hold off on any interventions right this moment.  Pt is not dilated enough to consider rupture. Preeclampsia:  NA Fetal Wellbeing: Category II Pain Control:  PRN IV meds I/D:  NA  Roberta Reed 12/14/2010, 8:02 AM

## 2010-12-14 NOTE — Anesthesia Preprocedure Evaluation (Signed)
Anesthesia Evaluation  Name, MR# and DOB Patient awake  General Assessment Comment  Reviewed: Allergy & Precautions, H&P , Patient's Chart, lab work & pertinent test results  Airway Mallampati: II TM Distance: >3 FB Neck ROM: full    Dental No notable dental hx.    Pulmonary  clear to auscultation  pulmonary exam normalPulmonary Exam Normal breath sounds clear to auscultation none    Cardiovascular regular Normal    Neuro/Psych Negative Neurological ROS  Negative Psych ROS  GI/Hepatic/Renal negative GI ROS, negative Liver ROS, and negative Renal ROS (+)       Endo/Other  Negative Endocrine ROS (+) Diabetes mellitus-,     Abdominal   Musculoskeletal   Hematology negative hematology ROS (+)   Peds  Reproductive/Obstetrics (+) Pregnancy    Anesthesia Other Findings             Anesthesia Physical Anesthesia Plan  ASA: III and Emergent  Anesthesia Plan: Epidural   Post-op Pain Management:    Induction:   Airway Management Planned:   Additional Equipment:   Intra-op Plan:   Post-operative Plan:   Informed Consent: I have reviewed the patients History and Physical, chart, labs and discussed the procedure including the risks, benefits and alternatives for the proposed anesthesia with the patient or authorized representative who has indicated his/her understanding and acceptance.     Plan Discussed with:   Anesthesia Plan Comments:         Anesthesia Quick Evaluation

## 2010-12-14 NOTE — Progress Notes (Signed)
Roberta Reed is a 40 y.o. G2P1001 at [redacted]w[redacted]d, admitted for IOL secondary to oligohydramnios  Subjective: Doing well, no complaints  Objective: BP 137/75  Pulse 69  Temp(Src) 98.7 F (37.1 C) (Axillary)  Resp 18  Ht 4\' 8"  (1.422 m)  Wt 163 lb 6 oz (74.106 kg)  BMI 36.63 kg/m2  SpO2 100%  LMP 03/14/2010  Fetal Heart Rate: 135 Variability: moderate Accelerations: present Decelerations: late decels present with most contractions  Contractions: Q79min  SVE:   Dilation: 6.5 Effacement (%): 80 Station: -1 Exam by:: cwicker,rnc  Pitocin: 3 Mag: NA   Assessment / Plan: 40 y.o. G2P1001 at [redacted]w[redacted]d here for IOL  Labor: No progress.  Have started amnioinfusion and notified attending.  If baby does not start looking better may have to consider c-section.  Currently, repositioning does appear to lessen decels.  Will dc pitocin. Preeclampsia:  NA Fetal Wellbeing: Category III Pain Control:  PRN IV meds I/D:  NA  Roberta Reed 12/14/2010, 11:10 AM

## 2010-12-14 NOTE — Anesthesia Preprocedure Evaluation (Addendum)
Anesthesia Evaluation  Name, MR# and DOB Patient awake  General Assessment Comment  Reviewed: Allergy & Precautions, H&P , Patient's Chart, lab work & pertinent test results  Airway Mallampati: II TM Distance: >3 FB Neck ROM: full    Dental  (+) Teeth Intact   Pulmonary  clear to auscultation  breath sounds clear to auscultation none    Cardiovascular regular Normal    Neuro/Psych   GI/Hepatic/Renal   Endo/Other    Abdominal   Musculoskeletal   Hematology   Peds  Reproductive/Obstetrics (+) Pregnancy    Anesthesia Other Findings VBAC           Anesthesia Physical Anesthesia Plan  ASA: II  Anesthesia Plan: Epidural   Post-op Pain Management:    Induction:   Airway Management Planned:   Additional Equipment:   Intra-op Plan:   Post-operative Plan:   Informed Consent:   Plan Discussed with:   Anesthesia Plan Comments:         Anesthesia Quick Evaluation

## 2010-12-14 NOTE — Progress Notes (Signed)
12/14/2010 Zniyah Roberta Reed  Interpreter  I assisted Dr Louanne Belton. Faculty Practice with explanation of plan of care.

## 2010-12-14 NOTE — Progress Notes (Signed)
Roberta Reed is a 40 y.o. G2P1001 at [redacted]w[redacted]d by  admitted for oligohydramnios.  Subjective: Pt reports that she is in pain and desires epidural.    Objective: BP 119/70  Pulse 68  Temp(Src) 98.9 F (37.2 C) (Oral)  Resp 20  Ht 4\' 8"  (1.422 m)  Wt 74.106 kg (163 lb 6 oz)  BMI 36.63 kg/m2  SpO2 99%  LMP 03/14/2010 I/O last 3 completed shifts: In: 625.8 [P.O.:480; I.V.:145.8] Out: -     FHT:  FHR: 145 bpm, variability: moderate,  accelerations:  Present,  decelerations:  Present late decel x 2; mild intermittent variable decel. UC:   irregular, every 4-6 minutes SVE:   Dilation: 5 Effacement (%): 50 Station: -1 Exam by:: Jerolyn Center, CNM  Labs: Lab Results  Component Value Date   WBC 8.6 12/13/2010   HGB 12.8 12/13/2010   HCT 38.7 12/13/2010   MCV 85.1 12/13/2010   PLT 190 12/13/2010    Assessment / Plan: Induction of labor due to oligohydramnios,  progressing well on pitocin  Labor: Progressing normally Preeclampsia:  n/a Fetal Wellbeing:  Category II Pain Control:  Epidural I/D:  n/a Anticipated MOD:  NSVD  [facility] 12/14/2010, 3:49 AM

## 2010-12-14 NOTE — Progress Notes (Signed)
Called to bedside for recurrent late decels.  Resolved with O2, IV bolus, and position change.  Cervical exam now 6-7/80/0.  Interpreter present and patient had no further questions.  She is comfortable with the epidural  Delton See MD

## 2010-12-15 LAB — CBC
Platelets: 143 10*3/uL — ABNORMAL LOW (ref 150–400)
RDW: 15.5 % (ref 11.5–15.5)
WBC: 9.4 10*3/uL (ref 4.0–10.5)

## 2010-12-15 NOTE — Progress Notes (Signed)
Subjective: Postpartum Day 1: Cesarean Delivery Patient reports incisional pain and tolerating PO.  No flatus no BM.  Foley in. Pain controlled with PO meds  Objective: Vital signs in last 24 hours: Temp:  [97.5 F (36.4 C)-99.4 F (37.4 C)] 98.7 F (37.1 C) (08/15 2337) Pulse Rate:  [57-86] 86  (08/15 2338) Resp:  [15-20] 18  (08/15 2338) BP: (102-149)/(63-88) 120/83 mmHg (08/15 2338) SpO2:  [97 %-100 %] 98 % (08/15 2100)  Physical Exam:  General: alert, cooperative and no distress Lochia: appropriate Uterine Fundus: firm Incision: dressing clean, dry intact DVT Evaluation: No evidence of DVT seen on physical exam. Negative Homan's sign.   Basename 12/15/10 0455 12/13/10 1747  HGB 10.7* 12.8  HCT 32.3* 38.7    Assessment/Plan: Status post Cesarean section. Doing well postoperatively.  Continue current care.  Foley out later today. Breast and bottle feeding  Lindaann Slough MD 12/15/2010, 6:17 AM

## 2010-12-15 NOTE — Anesthesia Postprocedure Evaluation (Signed)
  Anesthesia Post-op Note  Patient: Roberta Reed  Procedure(s) Performed: * No procedures listed *  Patient Location: PACU and Mother/Baby  Anesthesia Type: Epidural  Level of Consciousness: awake, alert , oriented and patient cooperative  Airway and Oxygen Therapy: Patient Spontanous Breathing  Post-op Pain: none  Post-op Assessment: Post-op Vital signs reviewed, Respiratory Function Stable, Patent Airway, No signs of Nausea or vomiting, Adequate PO intake and Pain level controlled  Post-op Vital Signs: Reviewed and stable  Complications: No apparent anesthesia complications

## 2010-12-15 NOTE — Progress Notes (Signed)
UR chart review completed.  

## 2010-12-15 NOTE — Progress Notes (Signed)
SW referral received however reason was not specified.  Chart review does not indicate SW trigger.  SW intervention was not provide.  Please contact SW if needed.

## 2010-12-16 MED ORDER — NORETHINDRONE 0.35 MG PO TABS: 1.0000 | ORAL_TABLET | Freq: Every day | ORAL | Status: DC

## 2010-12-16 MED ORDER — DOCUSATE SODIUM 100 MG PO CAPS: 100.0000 mg | ORAL_CAPSULE | Freq: Two times a day (BID) | ORAL | Status: AC | PRN

## 2010-12-16 MED ORDER — IBUPROFEN 600 MG PO TABS: 600.0000 mg | ORAL_TABLET | Freq: Four times a day (QID) | ORAL | Status: AC | PRN

## 2010-12-16 MED ORDER — OXYCODONE-ACETAMINOPHEN 5-325 MG PO TABS: 1.0000 | ORAL_TABLET | ORAL | Status: AC | PRN

## 2010-12-16 NOTE — Progress Notes (Signed)
Post Operative Day 2 s/p RLTCS Subjective: no complaints, tolerating PO and + flatus, normal lochia, present BM, present flatus, plans to breastfeed, plans to bottle feed, oral progesterone-only contraceptive  Objective: Blood pressure 124/81, pulse 58, temperature 97.5 F (36.4 C), temperature source Oral, resp. rate 18, height 4\' 8"  (1.422 m), weight 74.106 kg (163 lb 6 oz), last menstrual period 03/14/2010, SpO2 97.00%, unknown if currently breastfeeding.  Physical Exam:  General: alert and cooperative Lochia: appropriate Chest: CTAB Heart: RRR no m/r/g Abdomen: +BS, soft, nontender,  Uterine Fundus: FF at umbilicus Incision: c/d/i with steristrips in place, healing well. DVT Evaluation: No evidence of DVT seen on physical exam. Extremities: no c/c/e   [facility] 12/15/10 0455 12/13/10 1747  HGB 10.7* 12.8  HCT 32.3* 38.7    Assessment/Plan: Discharge home   LOS: 3 days   Cynde Menard 12/16/2010, 7:45 AM

## 2010-12-16 NOTE — Discharge Summary (Addendum)
Physician Discharge Summary  Patient ID: Roberta Reed MRN: [id number] DOB/AGE: [birth year] 39 y.o.  Admit date: 12/13/2010 Discharge date: 12/16/2010  Admission Diagnoses: IOL for oligohydramnios, previous c/s desire TOLAC, IUP at 39.2 wks  Discharge Diagnoses: s/p RLTCS for failed VBAC due to FTP   Discharged Condition: stable  Consults: none  Significant Diagnostic Studies:  Lab Results  Component Value Date   WBC 9.4 12/15/2010   HGB 10.7* 12/15/2010   HCT 32.3* 12/15/2010   MCV 85.9 12/15/2010   PLT 143* 12/15/2010     Significant procedures:  Same  APGAR: , ; weight . Delivery Note At 12:15 PM a viable female was delivered via C-Section, Low Transverse (Presentation: Left Occiput Anterior).  APGAR: 7, 9; weight 6 lb 2.2 oz (2785 g).   Placenta status: Intact, Manual removal.  Cord: 3 vessels with the following complications: None.  Cord pH: n/a   Mom to postpartum.  Baby to nursery-stable.   Hospital Course: pt presented initially for labor induction with a favorable cervix due to oligohydramnios.  She also had initially elevated blood pressures which subsequently normalized.  She underwent induction and progressed only to 6 cm.  During her labor she had a loss of fetal station and FTP, thus she underwent a RLTCS for the aforementioned reasons.  Her postoperative course was stable and she was d/c'd in stable condition on POD#2.    Disposition: Home  Discharge Orders    Future Orders Please Complete By Expires   Diet general      Activity as tolerated      Lifting restrictions      Comments:   Weight restriction of 5-10 lbs for 4 weeks   Sexual acrtivity      Comments:   Pelvic rest for 6 weeks.   No wound care      Call MD for:  temperature >100.4      Call MD for:  persistant nausea and vomiting      Call MD for:  severe uncontrolled pain      Call MD for:  redness, tenderness, or signs of infection (pain, swelling, redness, odor or green/yellow discharge  around incision site)      Call MD for:  difficulty breathing, headache or visual disturbances      Call MD for:  persistant dizziness or light-headedness      Strep B DNA probe      Comments:   This external order was created through the Results Console.   HIV antibody      Comments:   This external order was created through the Results Console.   Rubella antibody, IgM      Comments:   This external order was created through the Results Console.   Hepatitis B surface antigen      Comments:   This external order was created through the Results Console.   RPR      Comments:   This external order was created through the Results Console.   Antibody screen      Comments:   This external order was created through the Results Console.   ABO/Rh      Comments:   This external order was created through the Results Console.     Current Discharge Medication List    START taking these medications   Details  docusate sodium (COLACE) 100 MG capsule Take 1 capsule (100 mg total) by mouth 2 (two) times daily as needed for constipation. Qty: 60 capsule, Refills:  0    ibuprofen (ADVIL,MOTRIN) 600 MG tablet Take 1 tablet (600 mg total) by mouth every 6 (six) hours as needed for pain. Qty: 60 tablet, Refills: 0    norethindrone (MICRONOR,CAMILA,ERRIN) 0.35 MG tablet Take 1 tablet (0.35 mg total) by mouth daily. Qty: 1 Package, Refills: 3    oxyCODONE-acetaminophen (PERCOCET) 5-325 MG per tablet Take 1-2 tablets by mouth every 3 (three) hours as needed (moderate - severe pain). Qty: 45 tablet, Refills: 0      CONTINUE these medications which have NOT CHANGED   Details  prenatal vitamin w/FE, FA (PRENATAL 1 + 1) 27-1 MG TABS Take 1 tablet by mouth daily.         Follow-up Information    Follow up with [facility] [place]. Make an appointment in 4 weeks. (postop check and postpartum check.)    Contact information:   [address] [facility] [address]           Signed: CAMPBELL,LACHELLE 12/16/2010, 7:53 AM  Addendum: Patient decided to stay an additional day and was discharged home in stable condition on POD#3 Delton See MD

## 2010-12-16 NOTE — Progress Notes (Signed)
Interpreter used for questions.

## 2010-12-17 NOTE — Progress Notes (Signed)
Subjective: Postpartum Day 3: Cesarean Delivery Patient reports incisional pain, tolerating PO, + flatus, + BM and no problems voiding.    Objective: Vital signs in last 24 hours: Temp:  [97.5 F (36.4 C)-98.7 F (37.1 C)] 98.2 F (36.8 C) (08/17 2155) Pulse Rate:  [58-74] 74  (08/17 2155) Resp:  [16-18] 18  (08/17 2155) BP: (104-124)/(72-81) 119/75 mmHg (08/17 2155)  Physical Exam:  General: alert and cooperative Lochia: appropriate Uterine Fundus: firm Incision: healing well, no significant drainage, no dehiscence DVT Evaluation: No evidence of DVT seen on physical exam. Negative Homan's sign.   Basename 12/15/10 0455  HGB 10.7*  HCT 32.3*    Assessment/Plan: Status post Cesarean section. Doing well postoperatively.  Discharge home with standard precautions and return to clinic in 4-6 weeks.  Lindaann Slough MD 12/17/2010, 5:35 AM

## 2010-12-21 NOTE — Discharge Summary (Signed)
Agree with above note.  Roberta Reed 12/21/2010 2:36 PM

## 2011-01-09 ENCOUNTER — Encounter (HOSPITAL_COMMUNITY): Payer: Self-pay | Admitting: Obstetrics & Gynecology

## 2011-02-09 LAB — CBC
HCT: 29.1 — ABNORMAL LOW
HCT: 39
Hemoglobin: 10.2 — ABNORMAL LOW
MCHC: 35.1
MCV: 85.9
RBC: 4.54
RDW: 15.4 — ABNORMAL HIGH
WBC: 12.5 — ABNORMAL HIGH

## 2012-09-17 IMAGING — US US FETAL BPP W/O NONSTRESS
1 series · 14 of 15 positions shown · non-contrast
Comparison: none

[Series 1: us fetal bpp w/o nonstress · non-contrast · 15 acquisitions, 14 frames shown]
[im 1/15]
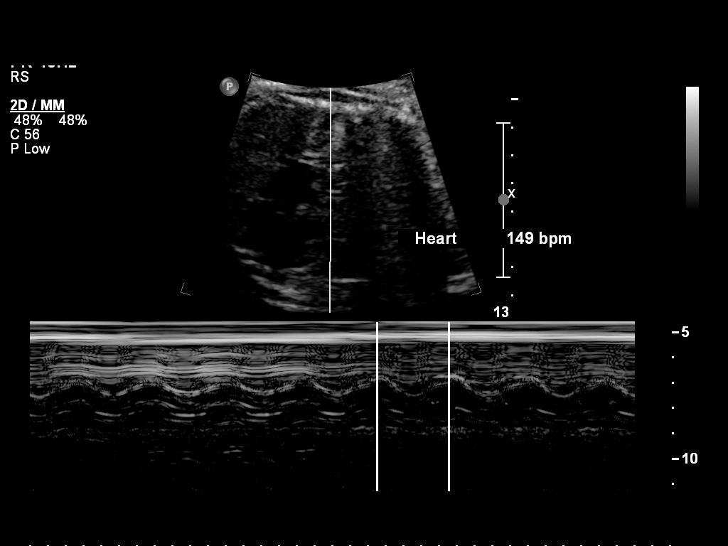
[im 2/15]
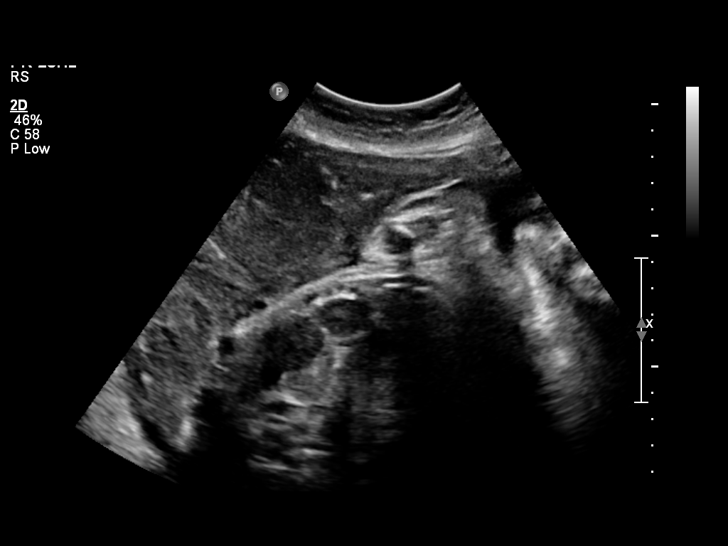
[im 3/15]
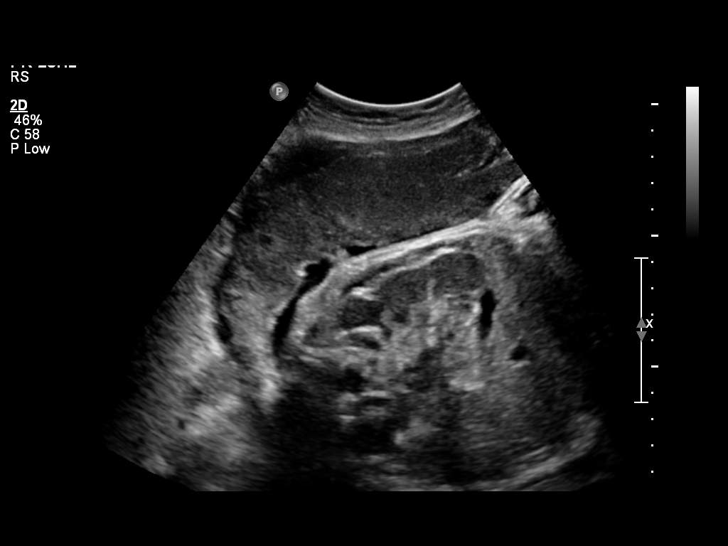
[im 4/15]
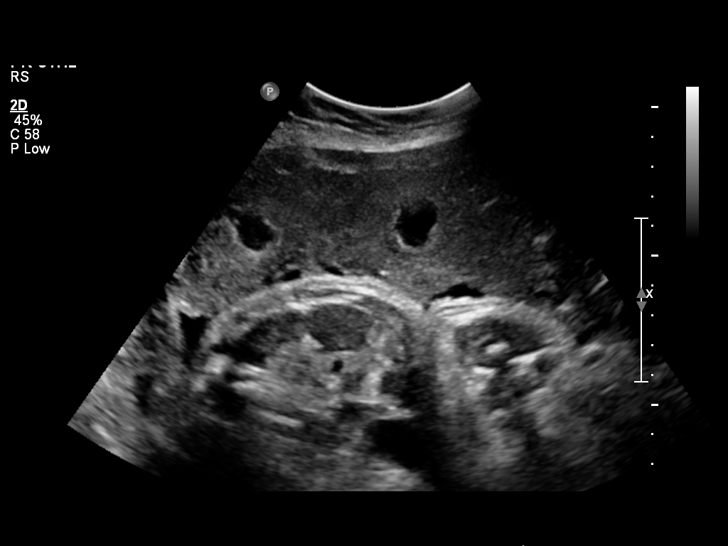
[im 5/15]
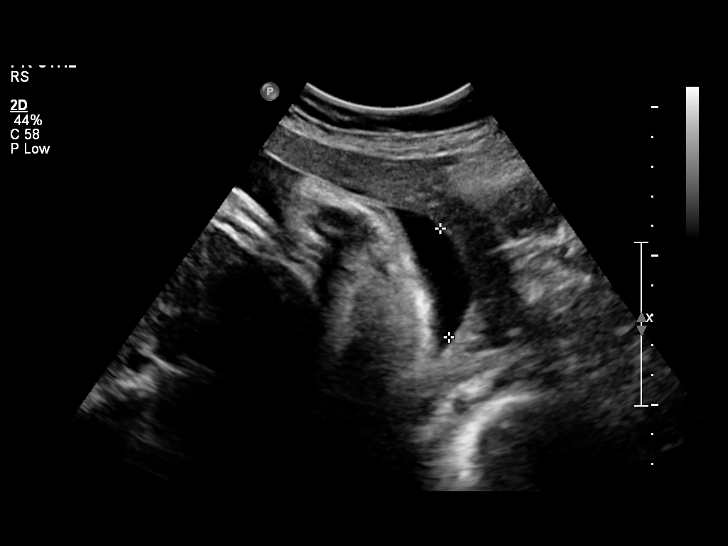
[im 6/15]
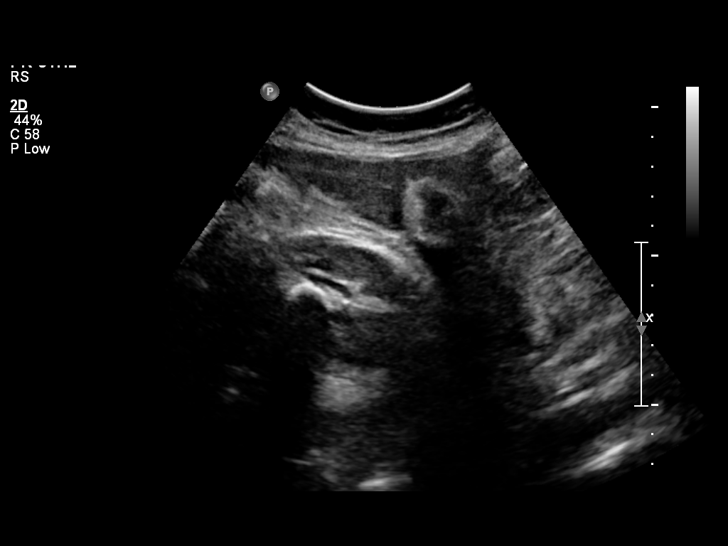
[im 7/15]
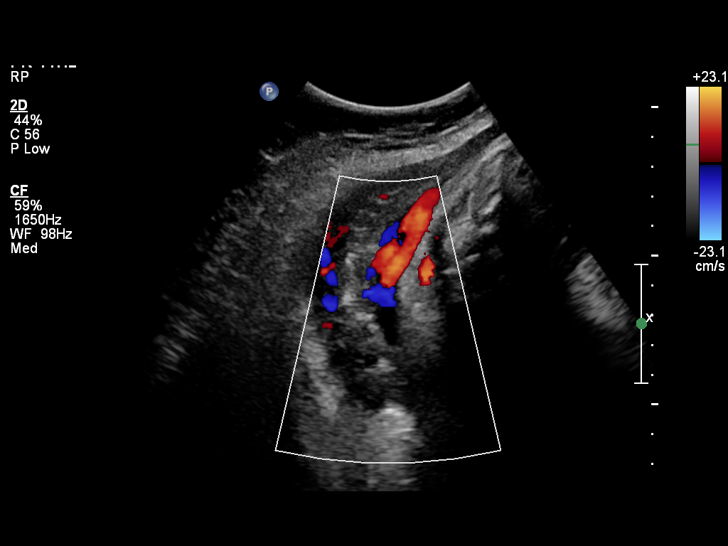
[im 9/15]
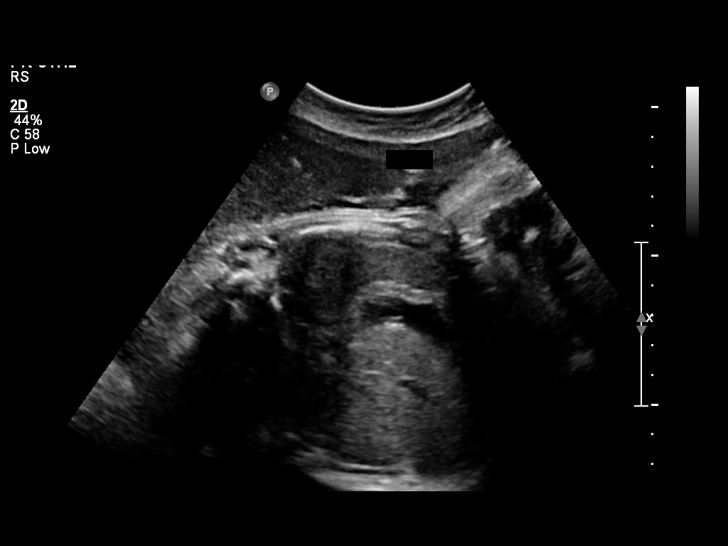
[im 10/15]
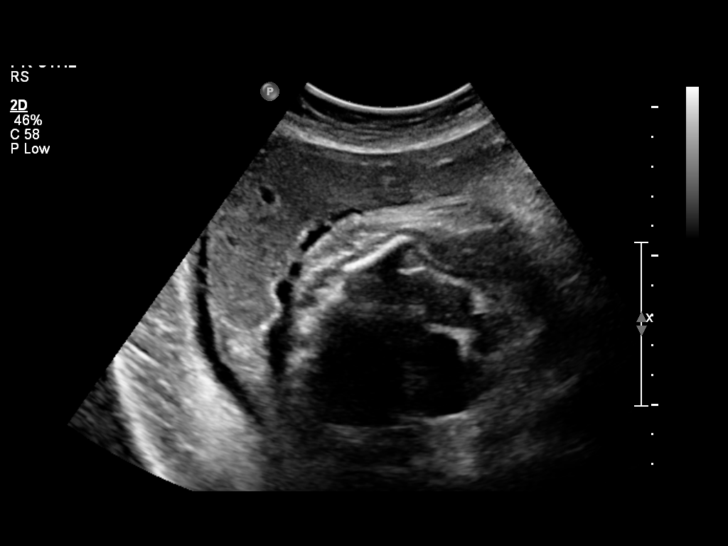
[im 11/15]
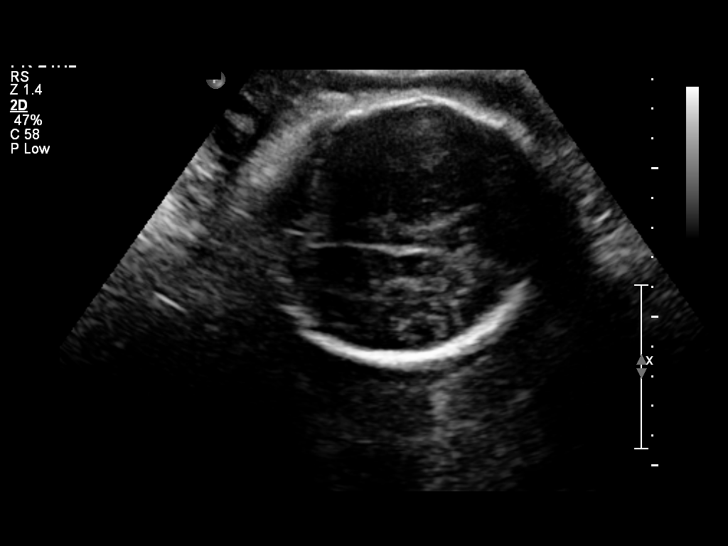
[im 12/15]
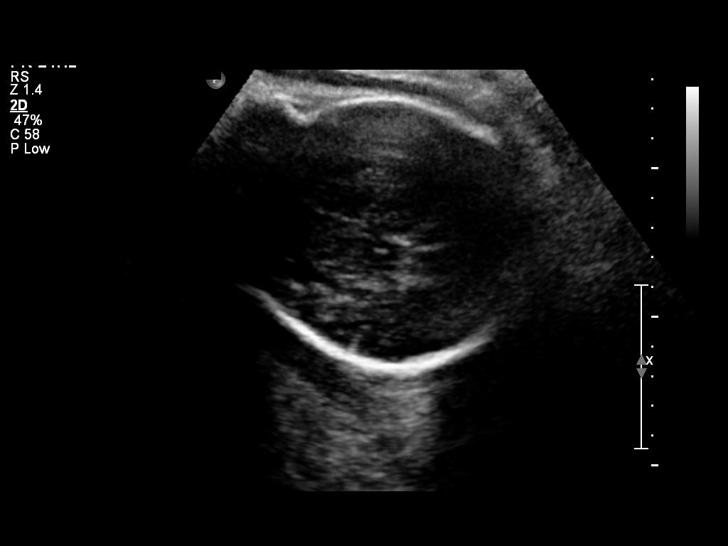
[im 13/15]
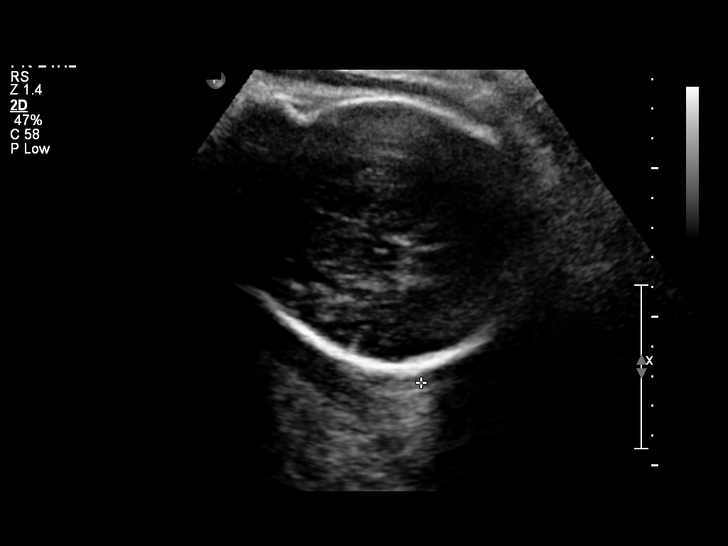
[im 14/15]
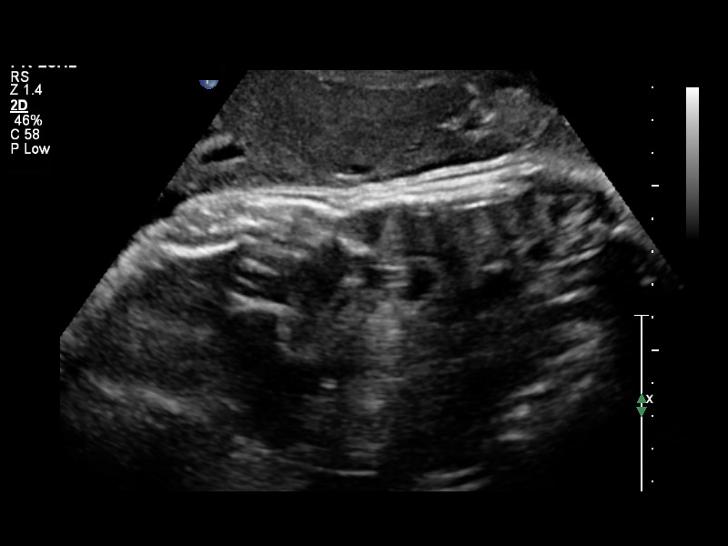
[im 15/15]
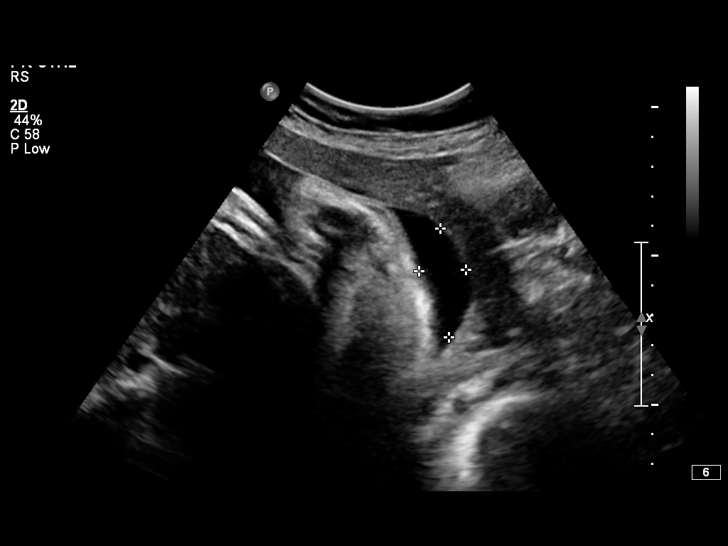

[14 of 15 positions shown; findings below may reference images not displayed]

OBSTETRICS REPORT
                      (Signed Final 12/13/2010 [DATE])

Procedures

Indications

 Non-reactive NST, FHR variables
Fetal Evaluation

 Preg. Location:    Intrauterine
 Fetal Heart Rate:  149                          bpm
 Cardiac Activity:  Observed
 Presentation:      Cephalic
 Placenta:          Anterior, above cervical os

 Amniotic Fluid
 AFI FV:      Oligohydramnios
 AFI Sum:     3.65    cm      < 3  %Tile     Larg Pckt:    3.65  cm
 LUQ:   3.65    cm
Biophysical Evaluation

 Amniotic F.V:   Oligohydramnios            F. Tone:        Observed
 F. Movement:    Observed                   Score:          [DATE]
 F. Breathing:   Observed
Gestational Age

 LMP:           39w 1d        Date:  03/14/10                 EDD:   12/19/10
 Best:          39w 1d     Det. By:  LMP  (03/14/10)          EDD:   12/19/10
Impression

 Single live IUP in cephalic presentation.  BPP [DATE]
 (oligohydramnios).

## 2013-04-05 ENCOUNTER — Ambulatory Visit: Payer: Self-pay | Admitting: Family Medicine

## 2013-04-05 VITALS — BP 106/70 | HR 70 | Temp 98.7°F | Resp 12 | Ht <= 58 in | Wt 146.0 lb

## 2013-04-05 DIAGNOSIS — M722 Plantar fascial fibromatosis: Secondary | ICD-10-CM

## 2013-04-05 DIAGNOSIS — Z789 Other specified health status: Secondary | ICD-10-CM | POA: Insufficient documentation

## 2013-04-05 MED ORDER — IBUPROFEN 600 MG PO TABS: 600.0000 mg | ORAL_TABLET | Freq: Three times a day (TID) | ORAL | Status: DC | PRN

## 2013-04-05 NOTE — Progress Notes (Signed)
[facility] [address], [place] [address] [phone]  Date:  04/05/2013   Name:  Roberta Reed   DOB:  10/22/1970   MRN:  [id number]  PCP:  No primary provider on file.    Chief Complaint: Foot Pain, Ankle Pain and Extremity Weakness   History of Present Illness:  Roberta Reed is a 42 y.o. very pleasant female patient who presents with the following:  She is here today with right ankle and heel pain. She has noted this for about 2 months.  She did not fall or have any particular injury.  The left foot and ankle is ok.   It hurts the most when she first gets up in the morning.  It hurts to walk on the foot when she first gets up and after she sits for a time.   She has not tried any medications or other treatments.    She is generally healthy and her LMP was a few days ago.   Here today with her husband and children.  None speak much [place].  Did my best with her in Spanish but then she began talking aobut her leg going numb.  At that time decided to call interpretation services  Patient Active Problem List   Diagnosis Date Noted  . Oligohydramnios 12/13/2010    Past Medical History  Diagnosis Date  . Gestational diabetes     pt states that she stopped taking meds 2-57months after birth of first baby.  . Depression     postpartum    Past Surgical History  Procedure Laterality Date  . Cesarean section      breech -2008  . Cesarean section  12/14/2010    Procedure: CESAREAN SECTION;  Surgeon: Lesly Dukes, MD;  Location: [facility];  Service: Gynecology;  Laterality: N/A;    History  Substance Use Topics  . Smoking status: Never Smoker   . Smokeless tobacco: Never Used  . Alcohol Use: No    No family history on file.  No Known Allergies  Medication list has been reviewed and updated.  Current Outpatient Prescriptions on File Prior to Visit  Medication Sig Dispense Refill  . prenatal vitamin w/FE, FA (PRENATAL 1  + 1) 27-1 MG TABS Take 1 tablet by mouth daily.        . norethindrone (MICRONOR,CAMILA,ERRIN) 0.35 MG tablet Take 1 tablet (0.35 mg total) by mouth daily.  1 Package  3   No current facility-administered medications on file prior to visit.    Review of Systems:  As per HPI- otherwise negative.   Physical Examination: Filed Vitals:   04/05/13 0905  BP: 106/70  Pulse: 70  Temp: 98.7 F (37.1 C)  Resp: 12   Filed Vitals:   04/05/13 0905  Height: 4' 9.5" (1.461 m)  Weight: 146 lb (66.225 kg)   Body mass index is 31.03 kg/(m^2). Ideal Body Weight: Weight in (lb) to have BMI = 25: 117.3  GEN: WDWN, NAD, Non-toxic, A & O x 3, obese HEENT: Atraumatic, Normocephalic. Neck supple. No masses, No LAD. Ears and Nose: No external deformity. CV: RRR, No M/G/R. No JVD. No thrill. No extra heart sounds. PULM: CTA B, no wheezes, crackles, rhonchi. No retractions. No resp. distress. No accessory muscle use. EXTR: No c/c/e NEURO Normal gait.  PSYCH: Normally interactive. Conversant. Not depressed or anxious appearing.  Calm demeanor.  Right foot:  She is tender at the plantar fascia insertion at the heel.  No swelling, redness or heat.  Ankle negative Otherwise her foot and ankle exam are normal.  Normal strength of her leg.  Normal patellar DTR bilaterally  Spoke with pt on the phone with Spanish interpreter.  Pt reports that she has numbness for 2 or 3 minutes at a time, and that it may occur every 2 or 3 hours.  Seems to be associated with the pain in her heel This is reassuring that we are not dealing with a neurological issue.   Assessment and Plan: Plantar fasciitis, right - Plan: ibuprofen (ADVIL,MOTRIN) 600 MG tablet discussed conservative treatment including stretches, ice massage and can rolls.  Ibuprofen as needed for pain.  Let us know if not feeling better in a few weeks    Signed Abbe Amsterdam, MD

## 2013-04-05 NOTE — Patient Instructions (Addendum)
Use la ibuprofen cada 8 horas cuando necesitas por dolor en el pie.  Si no esta mejor en 2 o 3 semanas por favor llamar or regresar a la clinica     Fascitis plantar (Plantar Fascitis) La fascitis plantar es un trastorno frecuente que ocasiona dolor en el pie. Se trata de una inflamacin (irritacin) de la banda de tejidos fibrosos y resistentes que se extienden desde el hueso del taln (calcneo) hasta la parte anterior de la planta del pie. Esta inflamacin puede deberse a que ha permanecido Intel, a zapatos que no Holiday representative, a correr [employer], al sobrepeso, a un andar anormal y el uso excesivo del pie con dolor (esto es frecuente en los corredores). Tambin es frecuente [employer] que practican ejercicios aerbicos y [place] bailarines de ballet. SNTOMAS La persona que sufre fascitis plantar manifiesta:  Dolor intenso por la [employer]urant parte inferior del pie, especialmente al dar los primeros pasos luego de levantarse de la cama. Este dolor disminuye luego de caminar algunos minutos.  Dolor intenso al caminar [employer] de un tiempo prolongado de inactividad.  El dolor empeora al caminar descalzo o al subir escaleras. DIAGNSTICO  El mdico har el diagnstico examinando sus pies.  En general no es necesario indicar radiografas. PREVENCIN  Consulte a Nurse, mental health en medicina del deporte antes de comenzar un nuevo programa de ejercicios.  Los programas de caminatas ofrecen un buen entrenamiento. Hay una menor probabilidad de sufrir lesiones por uso excesivo, que son frecuentes [place] personas que corren. Hay menos impacto y menos agresin a las articulaciones.  Comience lentamente todo nuevo programa de ejercicios. Si aparece algn problema o dolor, disminuya la cantidad de tiempo o la distancia hasta que se encuentre cmodo.  Use calzado de buena calidad y reemplcelo regularmente.  Estire el pie y los ligamentos que se  encuentran en la parte posterior del tobillo (tendn de [place]) antes y despus de Education officer, environmental actividad fsica.  Corra o practique ejercicios sobre superficies parejas que no sean duras. Por ejemplo, el asfalto es mejor que el pavimento.  No corra descalzo sobre superficies duras.  Si camina sobre cinta, vare la inclinacin.  No siga con el entrenamiento si tiene problemas en el pie o en la articulacin. Busque ayuda profesional si no mejora. INSTRUCCIONES PARA EL CUIDADO DOMICILIARIO  Evite las SUPERVALU INC causan dolor hasta que se recupere.  Use hielo o compresas fras sobre las zonas doloridas despus de Education officer, environmental ejercicios.  Only take over-the-counter or prescription medicines for pain, discomfort, or fever as directed by your caregiver.  Las plantillas blandas o las zapatillas con base de aire o gel pueden ser de [place].  Si los problemas continan o se agravan, consulte a Music therapist en medicina del deporte o con su mdico personal. La cortisona es un potente antiinflamatorio que puede inyectarse en la zona dolorida. El profesional que lo asiste comentar este tratamiento con usted. EST SEGURO QUE:   Comprende las instrucciones para el alta mdica.  Controlar su enfermedad.  Solicitar atencin mdica de inmediato segn las indicaciones. Document Released: 01/25/2005 Document Revised: 07/10/2011 [facility] Patient Information 2014 [place], [place].

## 2016-09-07 ENCOUNTER — Encounter: Payer: Self-pay | Admitting: Internal Medicine

## 2016-09-07 ENCOUNTER — Ambulatory Visit (INDEPENDENT_AMBULATORY_CARE_PROVIDER_SITE_OTHER): Payer: Self-pay | Admitting: Internal Medicine

## 2016-09-07 VITALS — BP 122/82 | HR 72 | Resp 12 | Ht <= 58 in | Wt 155.0 lb

## 2016-09-07 DIAGNOSIS — F32A Depression, unspecified: Secondary | ICD-10-CM | POA: Insufficient documentation

## 2016-09-07 DIAGNOSIS — M722 Plantar fascial fibromatosis: Secondary | ICD-10-CM

## 2016-09-07 DIAGNOSIS — O24419 Gestational diabetes mellitus in pregnancy, unspecified control: Secondary | ICD-10-CM | POA: Insufficient documentation

## 2016-09-07 DIAGNOSIS — Z6834 Body mass index (BMI) 34.0-34.9, adult: Secondary | ICD-10-CM

## 2016-09-07 DIAGNOSIS — O24415 Gestational diabetes mellitus in pregnancy, controlled by oral hypoglycemic drugs: Secondary | ICD-10-CM

## 2016-09-07 DIAGNOSIS — F329 Major depressive disorder, single episode, unspecified: Secondary | ICD-10-CM | POA: Insufficient documentation

## 2016-09-07 DIAGNOSIS — E6609 Other obesity due to excess calories: Secondary | ICD-10-CM

## 2016-09-07 MED ORDER — IBUPROFEN 200 MG PO TABS: ORAL_TABLET | ORAL | 0 refills | Status: AC

## 2016-09-07 NOTE — Progress Notes (Signed)
   Subjective:    Patient ID: Roberta Reed, female    DOB: 12-22-1970, 46 y.o.   MRN: [id number]  HPI   Here to establish  Plantar aspect of left heel hurts to bear weight.  Has been pretty constant in past month.   Does not walk on hard floors.  Does not go barefoot.   Hurts a lot when first gets out of bed in the morning. Has not tried treating this in any way.   Wearing slide in sandals without fixed back.  2.  Obesity:   Breakfast:  Coffee and milk with whole wheat toast--1-2 slices.   Sometimes tortillas, sometimes beans and rice. Sometimes eggs.  Lunch:  Eggs, rice and beans  Dinner: cereal or milk. Or toasted tortillas with cheese.  No juice or soda.   Current Meds  Medication Sig  . Multiple Vitamins-Iron (DAILY VITAMIN FORMULA+IRON PO) Take by mouth. 1 daily    No Known Allergies  Past Medical History:  Diagnosis Date  . Depression    postpartum  . Gestational diabetes    pt states that she stopped taking meds 2-253months after birth of first baby.    Past Surgical History:  Procedure Laterality Date  . CESAREAN SECTION     breech -2008  . CESAREAN SECTION  12/14/2010   Procedure: CESAREAN SECTION;  Surgeon: Lesly DukesKelly H. Leggett, MD;  Location: [facility];  Service: Gynecology;  Laterality: N/A;    No family history on file.   Social History   Social History  . Marital status: Single    Spouse name: N/A  . Number of children: N/A  . Years of education: N/A   Occupational History  . Not on file.   Social History Main Topics  . Smoking status: Never Smoker  . Smokeless tobacco: Never Used  . Alcohol use No  . Drug use: No  . Sexual activity: Yes    Birth control/ protection: None   Other Topics Concern  . Not on file   Social History Narrative  . No narrative on file    Review of Systems     Objective:   Physical Exam  Obese, NAD HEENT;  PERRL, EOMI, TMs pearly gray, throat without injection Neck:  Supple, No adenopathy, no  thyromegaly Chest:  CTA CV:  RRR with normal S1 and S2, No S3, S4 or murmur, radial and DP pulses normal and equal. Abd:  S, NT, No HSM or mass . + BS Feet:  Tender over left plantar heel       Assessment & Plan:  1.  Left plantar fasciitis:  Heel cups, Too always have foot support--no bare feet.  Stretches/exercises given. Ibuprofen 600 mg twice daily with meals  Weight loss.  2.  Obesity:  Long discussion on lifestyle changes regarding diet and physical activity.  Encouraged making small daily to weekly goals.

## 2016-09-07 NOTE — Patient Instructions (Addendum)
Tome un vaso de agua antes de cada comida Tome un minimo de 6 a 8 vasos de agua diarios Coma tres veces al dia Coma una proteina y Neomia Dearuna grasa saludable con comida.  (huevos, pescado, pollo, pavo, y limite carnes rojas Coma 5 porciones diarias de legumbres.  Mezcle los colores Coma 2 porciones diarias de frutas con cascara cuando sea comestible Use platos pequeos Suelte su tenedor o cuchara despues de cada mordida hata que se mastique y se trague Come en la mesa con amigos o familiares por lo menos una vez al dia Apague la televisin y aparatos electrnicos durante la comida  Su objetivo debe ser perder Neomia Dearuna libra por semana  Para su pie:  Heel cups o almoadas  ejercicios dos veces al dia  usar una botella congelada de agua y pon abajo de su pie mueve el Estée Lauderpie dos veces al dia   No usar Paramedicchangletas

## 2016-09-13 ENCOUNTER — Telehealth: Payer: Self-pay | Admitting: Licensed Clinical Social Worker

## 2016-09-13 NOTE — Telephone Encounter (Signed)
LCSW called pt to introduce social work services at the clinic. Pt reported that she had no needs at this time. 

## 2016-09-14 ENCOUNTER — Other Ambulatory Visit (INDEPENDENT_AMBULATORY_CARE_PROVIDER_SITE_OTHER): Payer: Self-pay

## 2016-09-14 DIAGNOSIS — Z13220 Encounter for screening for lipoid disorders: Secondary | ICD-10-CM

## 2016-09-14 DIAGNOSIS — R739 Hyperglycemia, unspecified: Secondary | ICD-10-CM

## 2016-09-14 DIAGNOSIS — Z6834 Body mass index (BMI) 34.0-34.9, adult: Secondary | ICD-10-CM

## 2016-09-14 DIAGNOSIS — E6609 Other obesity due to excess calories: Secondary | ICD-10-CM

## 2016-09-15 LAB — CBC WITH DIFFERENTIAL/PLATELET
BASOS: 0 %
Basophils Absolute: 0 10*3/uL (ref 0.0–0.2)
EOS (ABSOLUTE): 0.1 10*3/uL (ref 0.0–0.4)
EOS: 2 %
HEMATOCRIT: 39.9 % (ref 34.0–46.6)
HEMOGLOBIN: 13 g/dL (ref 11.1–15.9)
Immature Grans (Abs): 0 10*3/uL (ref 0.0–0.1)
Immature Granulocytes: 0 %
LYMPHS ABS: 1.8 10*3/uL (ref 0.7–3.1)
Lymphs: 24 %
MCH: 27.8 pg (ref 26.6–33.0)
MCHC: 32.6 g/dL (ref 31.5–35.7)
MCV: 85 fL (ref 79–97)
MONOCYTES: 5 %
Monocytes Absolute: 0.4 10*3/uL (ref 0.1–0.9)
NEUTROS ABS: 5.1 10*3/uL (ref 1.4–7.0)
Neutrophils: 69 %
Platelets: 263 10*3/uL (ref 150–379)
RBC: 4.68 x10E6/uL (ref 3.77–5.28)
RDW: 14.5 % (ref 12.3–15.4)
WBC: 7.4 10*3/uL (ref 3.4–10.8)

## 2016-09-15 LAB — COMPREHENSIVE METABOLIC PANEL
A/G RATIO: 1.4 (ref 1.2–2.2)
ALBUMIN: 4.5 g/dL (ref 3.5–5.5)
ALK PHOS: 73 IU/L (ref 39–117)
ALT: 102 IU/L — ABNORMAL HIGH (ref 0–32)
AST: 40 IU/L (ref 0–40)
BUN / CREAT RATIO: 20 (ref 9–23)
BUN: 8 mg/dL (ref 6–24)
Bilirubin Total: 0.4 mg/dL (ref 0.0–1.2)
CO2: 21 mmol/L (ref 18–29)
Calcium: 9.3 mg/dL (ref 8.7–10.2)
Chloride: 102 mmol/L (ref 96–106)
Creatinine, Ser: 0.41 mg/dL — ABNORMAL LOW (ref 0.57–1.00)
GFR calc Af Amer: 145 mL/min/{1.73_m2} (ref >59)
GFR calc non Af Amer: 126 mL/min/{1.73_m2} (ref >59)
GLOBULIN, TOTAL: 3.2 g/dL (ref 1.5–4.5)
Glucose: 84 mg/dL (ref 65–99)
POTASSIUM: 4.4 mmol/L (ref 3.5–5.2)
SODIUM: 140 mmol/L (ref 134–144)
Total Protein: 7.7 g/dL (ref 6.0–8.5)

## 2016-09-15 LAB — HGB A1C W/O EAG: HEMOGLOBIN A1C: 5.5 % (ref 4.8–5.6)

## 2016-09-15 LAB — LIPID PANEL W/O CHOL/HDL RATIO
CHOLESTEROL TOTAL: 165 mg/dL (ref 100–199)
HDL: 53 mg/dL (ref >39)
LDL Calculated: 91 mg/dL (ref 0–99)
TRIGLYCERIDES: 104 mg/dL (ref 0–149)
VLDL Cholesterol Cal: 21 mg/dL (ref 5–40)

## 2016-10-19 ENCOUNTER — Telehealth: Payer: Self-pay | Admitting: Internal Medicine

## 2016-10-19 NOTE — Telephone Encounter (Signed)
Patient stated she still having pain

## 2016-10-19 NOTE — Telephone Encounter (Signed)
Please call and find out exactly what symptoms she is having

## 2016-10-20 NOTE — Telephone Encounter (Signed)
Leg pain on the left side; more than 15 days

## 2016-10-20 NOTE — Telephone Encounter (Signed)
Patient scheduled for next Thursday @ 12:30 pm.

## 2016-10-26 ENCOUNTER — Ambulatory Visit (INDEPENDENT_AMBULATORY_CARE_PROVIDER_SITE_OTHER): Payer: Self-pay | Admitting: Internal Medicine

## 2016-10-26 ENCOUNTER — Encounter: Payer: Self-pay | Admitting: Internal Medicine

## 2016-10-26 VITALS — BP 122/82 | HR 70 | Resp 12 | Ht <= 58 in | Wt 151.0 lb

## 2016-10-26 DIAGNOSIS — R748 Abnormal levels of other serum enzymes: Secondary | ICD-10-CM

## 2016-10-26 DIAGNOSIS — M542 Cervicalgia: Secondary | ICD-10-CM

## 2016-10-26 DIAGNOSIS — L299 Pruritus, unspecified: Secondary | ICD-10-CM

## 2016-10-26 DIAGNOSIS — M5442 Lumbago with sciatica, left side: Secondary | ICD-10-CM

## 2016-10-26 DIAGNOSIS — M722 Plantar fascial fibromatosis: Secondary | ICD-10-CM

## 2016-10-27 LAB — HEPATIC FUNCTION PANEL
ALK PHOS: 93 IU/L (ref 39–117)
ALT: 91 IU/L — ABNORMAL HIGH (ref 0–32)
AST: 32 IU/L (ref 0–40)
Albumin: 4.5 g/dL (ref 3.5–5.5)
Bilirubin Total: 0.2 mg/dL (ref 0.0–1.2)
Bilirubin, Direct: 0.08 mg/dL (ref 0.00–0.40)
TOTAL PROTEIN: 7.5 g/dL (ref 6.0–8.5)

## 2016-11-06 ENCOUNTER — Encounter: Payer: Self-pay | Admitting: Internal Medicine

## 2016-11-06 NOTE — Progress Notes (Addendum)
   Subjective:    Patient ID: Roberta Reed, female    DOB: 05-Dec-1970, 46 y.o.   MRN: [id number]  HPI   Note transcribed from written note by Dr. Rodney Cruisearey Westermann. See written note in Media  Left knee and left heel pain and pain going up into hip and lower back x 1 1/2 months.  Taking Ibuprofen 400 mg q 6 hours.  Walking hurts, occ numbness in foot. Also wakes up with pain Right neck x 20 days ?inflammation, ear aches  No outpatient prescriptions have been marked as taking for the 10/26/16 encounter (Office Visit) with Julieanne MansonMulberry, Priscille Shadduck, MD.  Vitamins  No Known Allergies  Review of Systems     Objective:   Physical Exam Skin:  Nl HEENT:  Nl--mild tender R SCL muscle (neck) Full ROM without pain.  No lymph nodes palpable Chest:  Lungs clear, RRR CV:  - Murmur Abd/Pelvis:  Nontender, soft Extrem:  No LLE Back: Nontender ; SLR;  Full ROM - pain Neruo:  Nl.  LE neuro exam       Assessment & Plan:  1.  Left LBP>>Left leg-?Scitatica:  Stop Advil.  Start Tylenol 2 po q 6 hours prn.  Heat/ice.  Consider PT referral.  F/U 1 month 2.  Plantar Fasciitis:  Improving--heel inserts. 3.  Right Neck pain:  "wry neck"  --new pillow, warm compresses 4.  Elevated liver enzymes:  Recheck AST/ALT=hepatic panel.  Consider upper abd U/S if still increased 5.  Ears itch--try "ear dry " drops

## 2017-01-11 ENCOUNTER — Other Ambulatory Visit: Payer: Self-pay

## 2017-01-12 ENCOUNTER — Other Ambulatory Visit: Payer: Self-pay

## 2017-01-30 ENCOUNTER — Other Ambulatory Visit (INDEPENDENT_AMBULATORY_CARE_PROVIDER_SITE_OTHER): Payer: Self-pay

## 2017-01-30 DIAGNOSIS — R748 Abnormal levels of other serum enzymes: Secondary | ICD-10-CM

## 2017-01-31 LAB — HEPATIC FUNCTION PANEL
ALBUMIN: 4.4 g/dL (ref 3.5–5.5)
ALK PHOS: 75 IU/L (ref 39–117)
ALT: 56 IU/L — ABNORMAL HIGH (ref 0–32)
AST: 26 IU/L (ref 0–40)
BILIRUBIN TOTAL: 0.4 mg/dL (ref 0.0–1.2)
BILIRUBIN, DIRECT: 0.11 mg/dL (ref 0.00–0.40)
TOTAL PROTEIN: 7.5 g/dL (ref 6.0–8.5)

## 2017-02-17 ENCOUNTER — Encounter: Payer: Self-pay | Admitting: Internal Medicine

## 2017-02-17 DIAGNOSIS — R748 Abnormal levels of other serum enzymes: Secondary | ICD-10-CM | POA: Insufficient documentation

## 2017-03-01 ENCOUNTER — Other Ambulatory Visit: Payer: Self-pay

## 2017-03-06 ENCOUNTER — Other Ambulatory Visit: Payer: Self-pay

## 2017-03-06 DIAGNOSIS — R748 Abnormal levels of other serum enzymes: Secondary | ICD-10-CM

## 2017-03-07 LAB — HEPATITIS B SURFACE ANTIBODY,QUALITATIVE: Hep B Surface Ab, Qual: REACTIVE

## 2017-03-07 LAB — HEPATITIS B SURFACE ANTIGEN: HEP B S AG: NEGATIVE

## 2017-12-28 ENCOUNTER — Encounter: Payer: Self-pay | Admitting: Internal Medicine

## 2018-02-21 ENCOUNTER — Other Ambulatory Visit: Payer: Self-pay

## 2018-02-21 DIAGNOSIS — Z13220 Encounter for screening for lipoid disorders: Secondary | ICD-10-CM

## 2018-02-21 DIAGNOSIS — R748 Abnormal levels of other serum enzymes: Secondary | ICD-10-CM

## 2018-02-21 DIAGNOSIS — Z0000 Encounter for general adult medical examination without abnormal findings: Secondary | ICD-10-CM

## 2018-02-22 LAB — CBC WITH DIFFERENTIAL/PLATELET
BASOS: 0 %
Basophils Absolute: 0 10*3/uL (ref 0.0–0.2)
EOS (ABSOLUTE): 0.2 10*3/uL (ref 0.0–0.4)
Eos: 3 %
Hematocrit: 40 % (ref 34.0–46.6)
Hemoglobin: 13 g/dL (ref 11.1–15.9)
Immature Grans (Abs): 0 10*3/uL (ref 0.0–0.1)
Immature Granulocytes: 0 %
LYMPHS ABS: 1.8 10*3/uL (ref 0.7–3.1)
Lymphs: 28 %
MCH: 28.5 pg (ref 26.6–33.0)
MCHC: 32.5 g/dL (ref 31.5–35.7)
MCV: 88 fL (ref 79–97)
MONOS ABS: 0.3 10*3/uL (ref 0.1–0.9)
Monocytes: 5 %
NEUTROS PCT: 64 %
Neutrophils Absolute: 4.1 10*3/uL (ref 1.4–7.0)
PLATELETS: 269 10*3/uL (ref 150–450)
RBC: 4.56 x10E6/uL (ref 3.77–5.28)
RDW: 13.1 % (ref 12.3–15.4)
WBC: 6.5 10*3/uL (ref 3.4–10.8)

## 2018-02-22 LAB — COMPREHENSIVE METABOLIC PANEL
A/G RATIO: 1.3 (ref 1.2–2.2)
ALK PHOS: 83 IU/L (ref 39–117)
ALT: 118 IU/L — AB (ref 0–32)
AST: 40 IU/L (ref 0–40)
Albumin: 4.3 g/dL (ref 3.5–5.5)
BUN/Creatinine Ratio: 21 (ref 9–23)
BUN: 10 mg/dL (ref 6–24)
Bilirubin Total: 0.4 mg/dL (ref 0.0–1.2)
CHLORIDE: 102 mmol/L (ref 96–106)
CO2: 21 mmol/L (ref 20–29)
Calcium: 9.1 mg/dL (ref 8.7–10.2)
Creatinine, Ser: 0.48 mg/dL — ABNORMAL LOW (ref 0.57–1.00)
GFR calc Af Amer: 136 mL/min/{1.73_m2} (ref >59)
GFR, EST NON AFRICAN AMERICAN: 118 mL/min/{1.73_m2} (ref >59)
GLOBULIN, TOTAL: 3.3 g/dL (ref 1.5–4.5)
Glucose: 92 mg/dL (ref 65–99)
Potassium: 4.2 mmol/L (ref 3.5–5.2)
SODIUM: 139 mmol/L (ref 134–144)
Total Protein: 7.6 g/dL (ref 6.0–8.5)

## 2018-02-22 LAB — LIPID PANEL W/O CHOL/HDL RATIO
Cholesterol, Total: 170 mg/dL (ref 100–199)
HDL: 47 mg/dL (ref >39)
LDL Calculated: 98 mg/dL (ref 0–99)
TRIGLYCERIDES: 127 mg/dL (ref 0–149)
VLDL CHOLESTEROL CAL: 25 mg/dL (ref 5–40)

## 2018-02-27 NOTE — Progress Notes (Signed)
Elevated ALT--up higher now and has been prolonged.   Hepatitis B and C negative. Hep A total + in past Cholesterol fine

## 2018-02-27 NOTE — Addendum Note (Signed)
Addended by: Marcene Duos on: 02/27/2018 08:06 AM   Modules accepted: Orders

## 2018-03-06 ENCOUNTER — Ambulatory Visit (HOSPITAL_COMMUNITY)
Admission: RE | Admit: 2018-03-06 | Discharge: 2018-03-06 | Disposition: A | Discharge status: Home / Self Care | Payer: Self-pay | Source: Ambulatory Visit | Attending: Internal Medicine | Admitting: Internal Medicine

## 2018-03-06 DIAGNOSIS — R748 Abnormal levels of other serum enzymes: Secondary | ICD-10-CM | POA: Insufficient documentation

## 2018-05-10 ENCOUNTER — Other Ambulatory Visit: Payer: Self-pay

## 2018-05-10 DIAGNOSIS — R748 Abnormal levels of other serum enzymes: Secondary | ICD-10-CM

## 2018-05-11 LAB — HEPATIC FUNCTION PANEL
ALK PHOS: 90 IU/L (ref 39–117)
ALT: 68 IU/L — AB (ref 0–32)
AST: 30 IU/L (ref 0–40)
Albumin: 4.4 g/dL (ref 3.5–5.5)
BILIRUBIN, DIRECT: 0.09 mg/dL (ref 0.00–0.40)
Bilirubin Total: 0.4 mg/dL (ref 0.0–1.2)
Total Protein: 7.5 g/dL (ref 6.0–8.5)

## 2018-07-19 ENCOUNTER — Other Ambulatory Visit: Payer: Self-pay

## 2018-08-20 ENCOUNTER — Other Ambulatory Visit: Payer: Self-pay

## 2018-10-01 ENCOUNTER — Other Ambulatory Visit: Payer: Self-pay

## 2019-04-28 ENCOUNTER — Telehealth: Payer: Self-pay | Admitting: Internal Medicine

## 2019-04-28 NOTE — Telephone Encounter (Signed)
Pt. Called stating she was bleeding during interocuoirse with her partner .  Pt. Had not had sex for about a year now. Pt. States the bleeding stopped after the act. And has not had any more bleeding and denies ay other pain or symptoms  [name] to discuss with Dr. Amil Amen and will get back with pt.

## 2019-04-29 ENCOUNTER — Encounter: Payer: Self-pay | Admitting: Internal Medicine

## 2019-04-29 ENCOUNTER — Ambulatory Visit: Payer: Self-pay | Admitting: Internal Medicine

## 2019-04-29 ENCOUNTER — Other Ambulatory Visit: Payer: Self-pay

## 2019-04-29 VITALS — BP 122/78 | HR 72 | Resp 12 | Ht <= 58 in | Wt 158.0 lb

## 2019-04-29 DIAGNOSIS — N898 Other specified noninflammatory disorders of vagina: Secondary | ICD-10-CM

## 2019-04-29 DIAGNOSIS — N939 Abnormal uterine and vaginal bleeding, unspecified: Secondary | ICD-10-CM

## 2019-04-29 LAB — POCT WET PREP WITH KOH
Clue Cells Wet Prep HPF POC: NEGATIVE
KOH Prep POC: NEGATIVE
RBC Wet Prep HPF POC: NEGATIVE
Trichomonas, UA: NEGATIVE
Yeast Wet Prep HPF POC: NEGATIVE

## 2019-04-29 NOTE — Patient Instructions (Signed)
#  1:  Astroglide  #2:  Vagisil Silk

## 2019-04-29 NOTE — Progress Notes (Signed)
    Subjective:    Patient ID: Roberta Reed, female   DOB: 26-Jun-1970, 48 y.o.   MRN: [id number]   HPI   Painful intercourse with vaginal bleeding this past Sunday.   Her husband works out of town and had been gone for a year, during which time they had not had intercourse. They did not use an lubrication. She does still have regular periods.   She has noted decrease vaginal lubrication with intercourse.   Is having hot flashes, just once weekly.   She has had a white vaginal discharge in past week.  Perhaps a mild odor.    She has not had intercourse with anyone else.   She states her husband treats her well and she has no fear of him harming her. She states her husband takes time with her to be certain she is ready for intercourse.    Current Meds  Medication Sig  . Multiple Vitamins-Iron (DAILY VITAMIN FORMULA+IRON PO) Take by mouth. 1 daily   No Known Allergies   Review of Systems    Objective:   BP 122/78 (BP Location: Left Arm, Patient Position: Sitting, Cuff Size: Normal)   Pulse 72   Resp 12   Ht 4' 8.5" (1.435 m)   Wt 158 lb (71.7 kg)   LMP 04/15/2019 (Approximate)   BMI 34.80 kg/m   Physical Exam  NAD Lungs:  CTA CV:   RRR without murmur or rub.  Radial pulses normal and equal Abd:  S, NT, No HSM or mass, + BS GU:  Normal external female genitalia.  Initially, did not see any vaginal mucosal lesions on insertion and gradual removal of speculum.  No cervical lesion or blood from cervical os.  No uterine or adnexal mass or tenderness.  No CMT.  Scant off white vaginal secretions.  No odor. On withdrawal of gloved hand with bimanual exam, however, significant bright red blood on hand.   Speculum reinserted and on removal found source of blood was a small tear at entry of vaginal opening between 5 and 6 O'Clock.  Obviously painful here.   Assessment & Plan   1.  Dyspareunia and subsequent bleeding after no vaginal intercourse for a year.  No  findings other than the small vaginal orifice tear.  Discussed waiting 1-2 weeks for this to heal, then using vaginal lubrication and taking intercourse slowly. No concerning findings on exam No evidence of atrophy Wet prep normal Sending GC/chlamydia, but expect this to be normal.

## 2019-04-30 NOTE — Telephone Encounter (Signed)
Patient came in for appointment on yesterday regarding symptoms

## 2019-05-01 LAB — GC/CHLAMYDIA PROBE AMP
Chlamydia trachomatis, NAA: NEGATIVE
Neisseria Gonorrhoeae by PCR: NEGATIVE

## 2019-06-08 ENCOUNTER — Encounter: Payer: Self-pay | Admitting: Internal Medicine

## 2020-11-30 ENCOUNTER — Other Ambulatory Visit: Payer: Self-pay | Admitting: Obstetrics and Gynecology

## 2020-11-30 DIAGNOSIS — Z1231 Encounter for screening mammogram for malignant neoplasm of breast: Secondary | ICD-10-CM

## 2020-12-30 ENCOUNTER — Other Ambulatory Visit: Payer: Self-pay | Admitting: Obstetrics and Gynecology

## 2020-12-30 DIAGNOSIS — Z1231 Encounter for screening mammogram for malignant neoplasm of breast: Secondary | ICD-10-CM

## 2021-01-18 ENCOUNTER — Other Ambulatory Visit: Payer: Self-pay

## 2021-01-18 ENCOUNTER — Ambulatory Visit
Admission: RE | Admit: 2021-01-18 | Discharge: 2021-01-18 | Disposition: A | Discharge status: Home / Self Care | Payer: No Typology Code available for payment source | Source: Ambulatory Visit | Attending: Obstetrics and Gynecology | Admitting: Obstetrics and Gynecology

## 2021-01-18 DIAGNOSIS — Z1231 Encounter for screening mammogram for malignant neoplasm of breast: Secondary | ICD-10-CM

## 2021-02-04 ENCOUNTER — Other Ambulatory Visit: Payer: Self-pay | Admitting: Obstetrics and Gynecology

## 2021-02-04 DIAGNOSIS — R928 Other abnormal and inconclusive findings on diagnostic imaging of breast: Secondary | ICD-10-CM

## 2021-03-03 ENCOUNTER — Ambulatory Visit: Payer: Self-pay | Admitting: *Deleted

## 2021-03-03 ENCOUNTER — Other Ambulatory Visit: Payer: Self-pay

## 2021-03-03 ENCOUNTER — Encounter (INDEPENDENT_AMBULATORY_CARE_PROVIDER_SITE_OTHER): Payer: Self-pay

## 2021-03-03 ENCOUNTER — Ambulatory Visit
Admission: RE | Admit: 2021-03-03 | Discharge: 2021-03-03 | Disposition: A | Discharge status: Home / Self Care | Payer: No Typology Code available for payment source | Source: Ambulatory Visit | Attending: Obstetrics and Gynecology | Admitting: Obstetrics and Gynecology

## 2021-03-03 VITALS — BP 118/78 | Wt 150.6 lb

## 2021-03-03 DIAGNOSIS — Z1239 Encounter for other screening for malignant neoplasm of breast: Secondary | ICD-10-CM

## 2021-03-03 DIAGNOSIS — Z1211 Encounter for screening for malignant neoplasm of colon: Secondary | ICD-10-CM

## 2021-03-03 DIAGNOSIS — R928 Other abnormal and inconclusive findings on diagnostic imaging of breast: Secondary | ICD-10-CM

## 2021-03-03 NOTE — Patient Instructions (Signed)
Explained breast self awareness with Roberta Reed. Patient did not need a Pap smear today due to last Pap smear was 11/26/2020. Let patient know that based on the result of her last Pap smear that her next Pap smear due in 3 years. Referred patient to the [facility] for a left breast ultrasound per recommendation. Appointment scheduled Thursday, March 03, 2021 at 1250. Patient aware of appointment and will be there. Roberta Reed verbalized understanding.  Malcome Ambrocio, Kathaleen Maser, RN 11:21 AM

## 2021-03-03 NOTE — Progress Notes (Signed)
[name] is a 51 y.o. female who presents to [facility] clinic today with no complaints. Patient referred to [facility] by the [facility] due to recommending left breast ultrasound for follow-up. Screening mammogram completed 01/18/2021.   Pap Smear: Pap smear not completed today. Last Pap smear was 11/26/2020 at the [facility] clinic and was abnormal - ASCUS with negative HPV . Per patient has no history of an abnormal Pap smear prior to her most recent Pap smear. Last Pap smear result is available in Epic.   Physical exam: Breasts Breasts symmetrical. No skin abnormalities bilateral breasts. No nipple retraction bilateral breasts. No nipple discharge bilateral breasts. No lymphadenopathy. No lumps palpated bilateral breasts. No complaints of pain or tenderness on exam.     MM 3D SCREEN BREAST BILATERAL  Result Date: 02/03/2021 CLINICAL DATA:  Screening. EXAM: DIGITAL SCREENING BILATERAL MAMMOGRAM WITH TOMOSYNTHESIS AND CAD TECHNIQUE: Bilateral screening digital craniocaudal and mediolateral oblique mammograms were obtained. Bilateral screening digital breast tomosynthesis was performed. The images were evaluated with computer-aided detection. COMPARISON:  None. ACR Breast Density Category b: There are scattered areas of fibroglandular density. FINDINGS: In the left breast, a possible mass warrants further evaluation. In the right breast, no findings suspicious for malignancy. IMPRESSION: Further evaluation is suggested for possible mass in the left breast. RECOMMENDATION: Ultrasound of the left breast. (Code:US-L-27M) The patient will be contacted regarding the findings, and additional imaging will be scheduled. BI-RADS CATEGORY  0: Incomplete. Need additional imaging evaluation and/or prior mammograms for comparison. Electronically Signed   By: Sherian Rein M.D.   On: 02/03/2021 13:10    Pelvic/Bimanual Pap is not indicated today per BCCCP guidelines.    Smoking History: Patient has never smoked.   Patient Navigation: Patient education provided. Access to services provided for patient through [place] program. Spanish interpreter Natale Lay from [facility] provided.   Colorectal Cancer Screening: Per patient has never had colonoscopy completed. FIT Test given to patient to complete. No complaints today.    Breast and Cervical Cancer Risk Assessment: Patient does not have family history of breast cancer, known genetic mutations, or radiation treatment to the chest before age 60. Patient does not have history of cervical dysplasia, immunocompromised, or DES exposure in-utero.  Risk Assessment     Risk Scores       03/03/2021   Last edited by: Meryl Dare, CMA   5-year risk: 0.8 %   Lifetime risk: 8 %            A: BCCCP exam without pap smear No complaints.  P: Referred patient to the [facility] for a left breast ultrasound per recommendation. Appointment scheduled Thursday, March 03, 2021 at 1250.  Priscille Heidelberg, RN 03/03/2021 11:21 AM

## 2021-04-20 ENCOUNTER — Telehealth: Payer: Self-pay

## 2021-04-20 LAB — FECAL OCCULT BLOOD, IMMUNOCHEMICAL

## 2021-04-20 NOTE — Telephone Encounter (Signed)
Left message for patient via [employer] for patient to call us back in regards to her FIT test. Left name and number for patient to call back.

## 2021-04-21 ENCOUNTER — Telehealth: Payer: Self-pay

## 2021-04-21 ENCOUNTER — Other Ambulatory Visit: Payer: Self-pay

## 2021-04-21 DIAGNOSIS — Z1211 Encounter for screening for malignant neoplasm of colon: Secondary | ICD-10-CM

## 2021-04-21 NOTE — Telephone Encounter (Signed)
Called patient via [employer] # [phone] to inform patient that FIT test sample was to old for the lab analyze and that we will need to send new test in mail to complete. Patient voiced understanding. Will mail new test to patient to complete.

## 2021-06-08 LAB — FECAL OCCULT BLOOD, IMMUNOCHEMICAL: Fecal Occult Bld: NEGATIVE

## 2021-06-09 ENCOUNTER — Telehealth: Payer: Self-pay

## 2021-06-09 NOTE — Telephone Encounter (Signed)
Called patient via Erika McReynolds, [employer] to give FIT test results. Informed patient that FIT test was Normal. Patient voiced understanding. 

## 2021-11-28 ENCOUNTER — Other Ambulatory Visit: Payer: Self-pay | Admitting: Obstetrics and Gynecology

## 2021-11-28 DIAGNOSIS — Z1231 Encounter for screening mammogram for malignant neoplasm of breast: Secondary | ICD-10-CM

## 2022-01-17 ENCOUNTER — Ambulatory Visit: Payer: No Typology Code available for payment source

## 2022-02-02 ENCOUNTER — Telehealth: Payer: Self-pay

## 2022-02-02 NOTE — Telephone Encounter (Signed)
Patient telephoned [place] office, returned patient's call using language line interpreter#[id number]. Left voice message at mobile number, no answer at home number.

## 2022-02-08 ENCOUNTER — Telehealth: Payer: Self-pay

## 2022-02-08 NOTE — Telephone Encounter (Signed)
Patient telephoned [place] office, returned her call using language line interpreter#[id number]. Left voice message, BCCCP will call back  on the next day with in office spanish interpreter

## 2022-02-21 ENCOUNTER — Ambulatory Visit
Admission: RE | Admit: 2022-02-21 | Discharge: 2022-02-21 | Disposition: A | Discharge status: Home / Self Care | Payer: No Typology Code available for payment source | Source: Ambulatory Visit | Attending: Obstetrics and Gynecology | Admitting: Obstetrics and Gynecology

## 2022-02-21 ENCOUNTER — Ambulatory Visit: Payer: Self-pay | Admitting: *Deleted

## 2022-02-21 VITALS — BP 117/75 | Wt 149.4 lb

## 2022-02-21 DIAGNOSIS — Z1239 Encounter for other screening for malignant neoplasm of breast: Secondary | ICD-10-CM

## 2022-02-21 DIAGNOSIS — Z1231 Encounter for screening mammogram for malignant neoplasm of breast: Secondary | ICD-10-CM

## 2022-02-21 NOTE — Progress Notes (Signed)
[name] is a 51 y.o. female who presents to [facility] clinic today with no complaints.    Pap Smear: Pap smear not completed today. Last Pap smear was 11/26/2020 at the [facility] clinic and was abnormal - ASCUS with negative HPV . Per patient has no history of an abnormal Pap smear prior to her most recent Pap smear. Last Pap smear result is available in Epic.   Physical exam: Left breast larger than right breast that per patient is normal for her. No skin abnormalities bilateral breasts. No nipple retraction bilateral breasts. No nipple discharge bilateral breasts. No lymphadenopathy. No lumps palpated bilateral breasts. No complaints of pain or tenderness on exam.     MM 3D SCREEN BREAST BILATERAL  Result Date: 02/03/2021 CLINICAL DATA:  Screening. EXAM: DIGITAL SCREENING BILATERAL MAMMOGRAM WITH TOMOSYNTHESIS AND CAD TECHNIQUE: Bilateral screening digital craniocaudal and mediolateral oblique mammograms were obtained. Bilateral screening digital breast tomosynthesis was performed. The images were evaluated with computer-aided detection. COMPARISON:  None. ACR Breast Density Category b: There are scattered areas of fibroglandular density. FINDINGS: In the left breast, a possible mass warrants further evaluation. In the right breast, no findings suspicious for malignancy. IMPRESSION: Further evaluation is suggested for possible mass in the left breast. RECOMMENDATION: Ultrasound of the left breast. (Code:US-L-40M) The patient will be contacted regarding the findings, and additional imaging will be scheduled. BI-RADS CATEGORY  0: Incomplete. Need additional imaging evaluation and/or prior mammograms for comparison. Electronically Signed   By: Abelardo Diesel M.D.   On: 02/03/2021 13:10    Pelvic/Bimanual Pap is not indicated today per BCCCP guidelines.   Smoking History: Patient has never smoked.   Patient Navigation: Patient education provided. Access to  services provided for patient through [place] program. Spanish interpreter Rudene Anda from [facility] provided.   Colorectal Cancer Screening: Per patient has never had colonoscopy completed. Patient completed a FIT Test 06/06/2021 that was negative. No complaints today.    Breast and Cervical Cancer Risk Assessment: Patient does not have family history of breast cancer, known genetic mutations, or radiation treatment to the chest before age 68. Patient does not have history of cervical dysplasia, immunocompromised, or DES exposure in-utero.  Risk Assessment     Risk Scores       02/21/2022 03/03/2021   Last edited by: Royston Bake, CMA Royston Bake, CMA   5-year risk: 0.9 % 0.8 %   Lifetime risk: 7.9 % 8 %           A: BCCCP exam without pap smear No complaints.  P: Referred patient to the [facility] for a screening mammogram on mobile unit. Appointment scheduled Tuesday, February 21, 2022 at 1120.  Loletta Parish, RN 02/21/2022 10:28 AM

## 2022-02-21 NOTE — Patient Instructions (Addendum)
Explained breast self awareness with Roberta Reed. Patient did not need a Pap smear today due to last Pap smear and HPV typing was 11/26/2020. Scheduled patient a Pap smear at the free cervical cancer screening Monday, March 27, 2022 at 1315. Referred patient to the [facility] for a screening mammogram on mobile unit. Appointment scheduled Tuesday, February 21, 2022 at 1120. Patient aware of appointment and will be there. Let patient know that the Breast Center will follow up with her within the next couple of weeks with results of her mammogram by letter or phone. Roberta Reed verbalized understanding.  Asher Torpey, Arvil Chaco, RN 10:28 AM

## 2022-03-27 ENCOUNTER — Other Ambulatory Visit: Payer: Self-pay | Admitting: Hematology and Oncology

## 2022-03-27 DIAGNOSIS — Z124 Encounter for screening for malignant neoplasm of cervix: Secondary | ICD-10-CM

## 2022-03-27 NOTE — Progress Notes (Signed)
Patient: Roberta Reed           Date of Birth: [birth year]           MRN: [id number] Visit Date: 03/27/2022 PCP: Julieanne Manson, MD  Cervical Cancer Screening Do you smoke?: No Have you ever had or been told you have an allergy to latex products?: No Marital status: Married Date of last pap smear: 1-2 yrs ago Date of last menstrual period: 03/22/22 Number of pregnancies: 2 Number of births: 2 Have you ever had any of the following? Hysterectomy: No Tubal ligation (tubes tied): No Abnormal bleeding: No Abnormal pap smear: Yes Venereal warts: No A sex partner with venereal warts: No A high risk* sex partner: No  Cervical Exam  Abnormal Observations: Normal exam Recommendations: Will repeat Pap smear in 5 years if normal and HPV-      Patient's History Patient Active Problem List   Diagnosis Date Noted   Elevated liver enzymes    Gestational diabetes    Depression    Language barrier 04/05/2013   Oligohydramnios 12/13/2010   Past Medical History:  Diagnosis Date   Depression    postpartum   Elevated liver enzymes 2011, 2012, 2018   Negative Hep C, Hep B not clear, + Hep A in 2011   Gestational diabetes    pt states that she stopped taking meds 2-40months after birth of first baby.    Family History  Problem Relation Age of Onset   Hypertension Mother    Hyperlipidemia Mother    Breast cancer Neg Hx     Social History   Occupational History   Not on file  Tobacco Use   Smoking status: Never   Smokeless tobacco: Never  Vaping Use   Vaping Use: Never used  Substance and Sexual Activity   Alcohol use: No   Drug use: No   Sexual activity: Yes    Birth control/protection: Condom

## 2022-03-30 LAB — CYTOLOGY - PAP
Comment: NEGATIVE
Diagnosis: NEGATIVE
High risk HPV: NEGATIVE

## 2022-10-24 IMAGING — MG MM DIGITAL SCREENING BILAT W/ TOMO AND CAD
6 of 10 series · 6 of 30 positions shown · non-contrast
Comparison: None.

CLINICAL DATA: Screening.

EXAM:
DIGITAL SCREENING BILATERAL MAMMOGRAM WITH TOMOSYNTHESIS AND CAD
TECHNIQUE: Bilateral screening digital craniocaudal and mediolateral oblique
mammograms were obtained. Bilateral screening digital breast
tomosynthesis was performed. The images were evaluated with
computer-aided detection.

[R CC synth-2D]
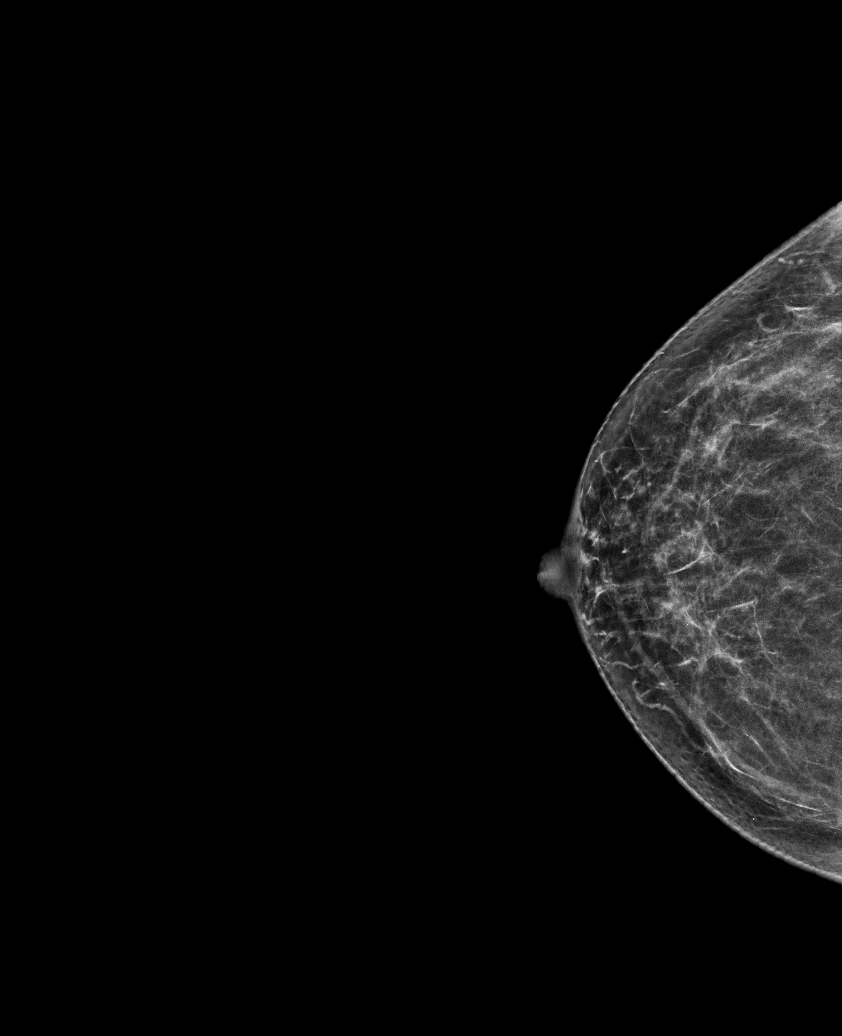

[L CC synth-2D]
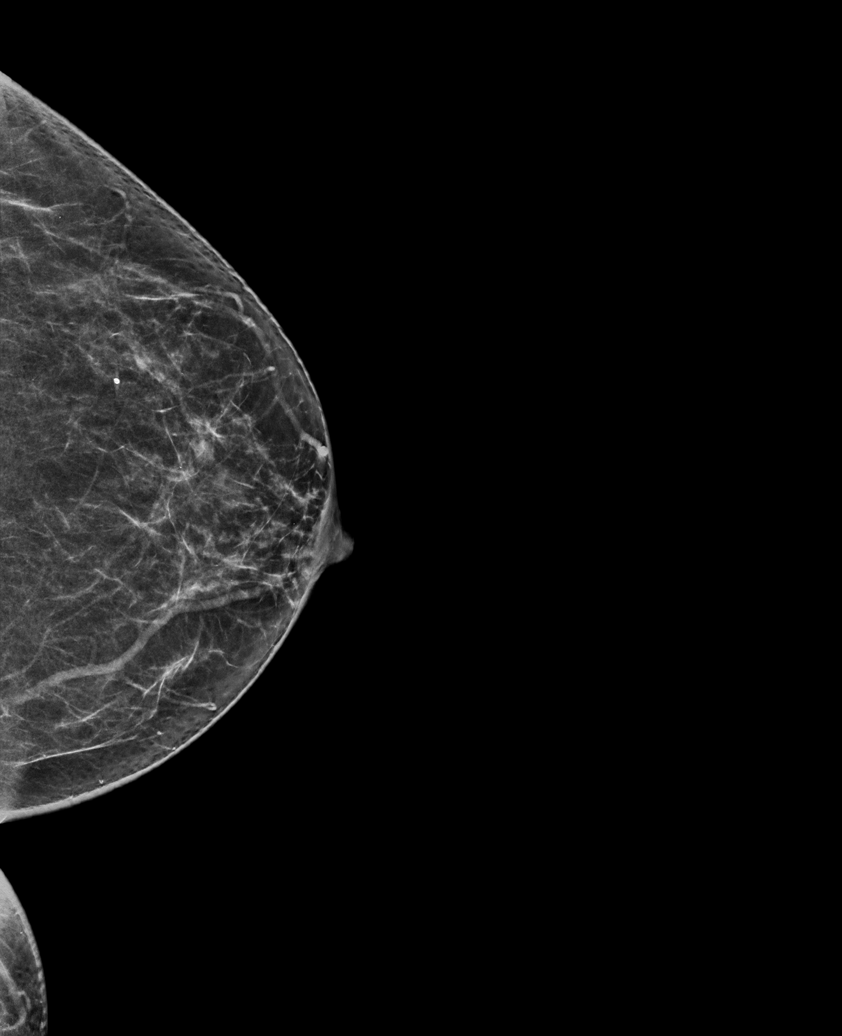

[R MLO synth-2D]
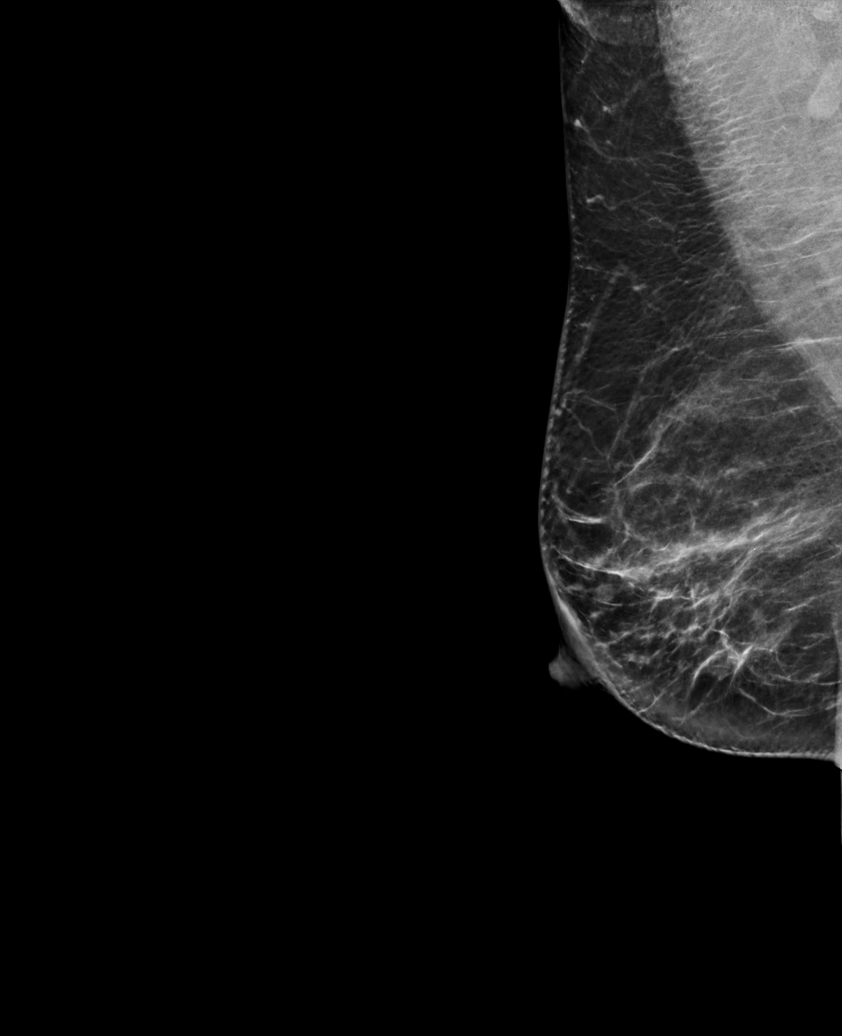

[R XCCL synth-2D]
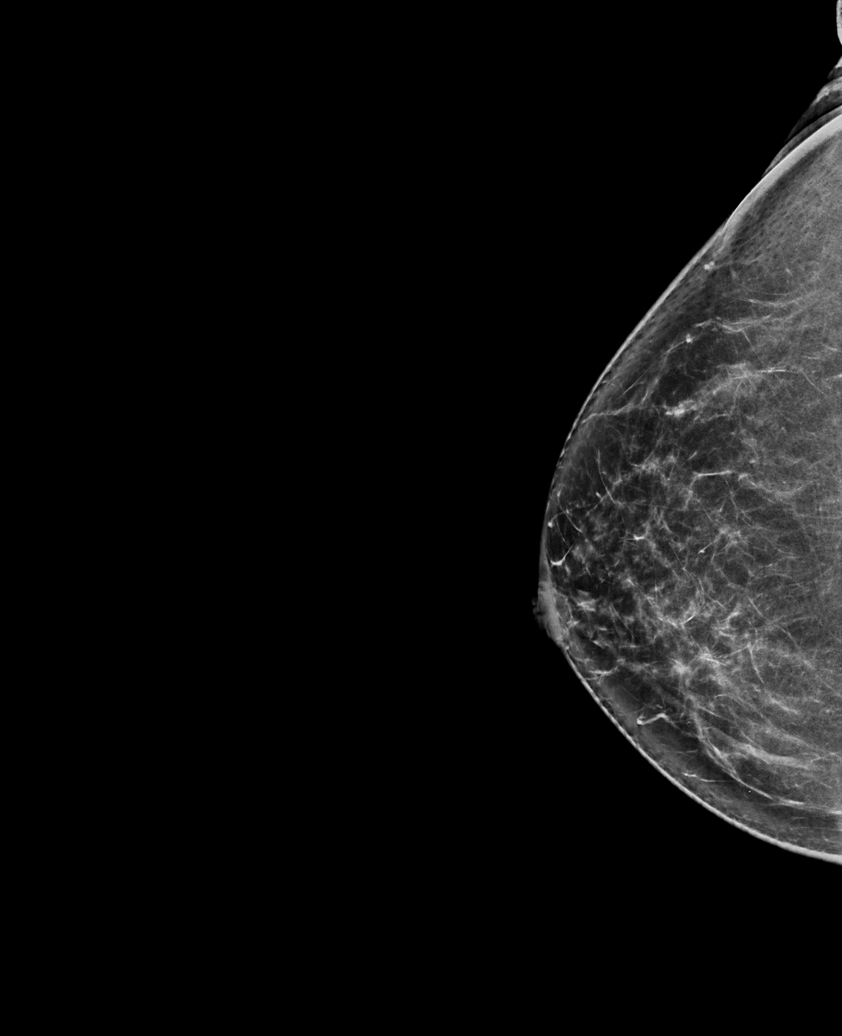

[L MLO synth-2D]
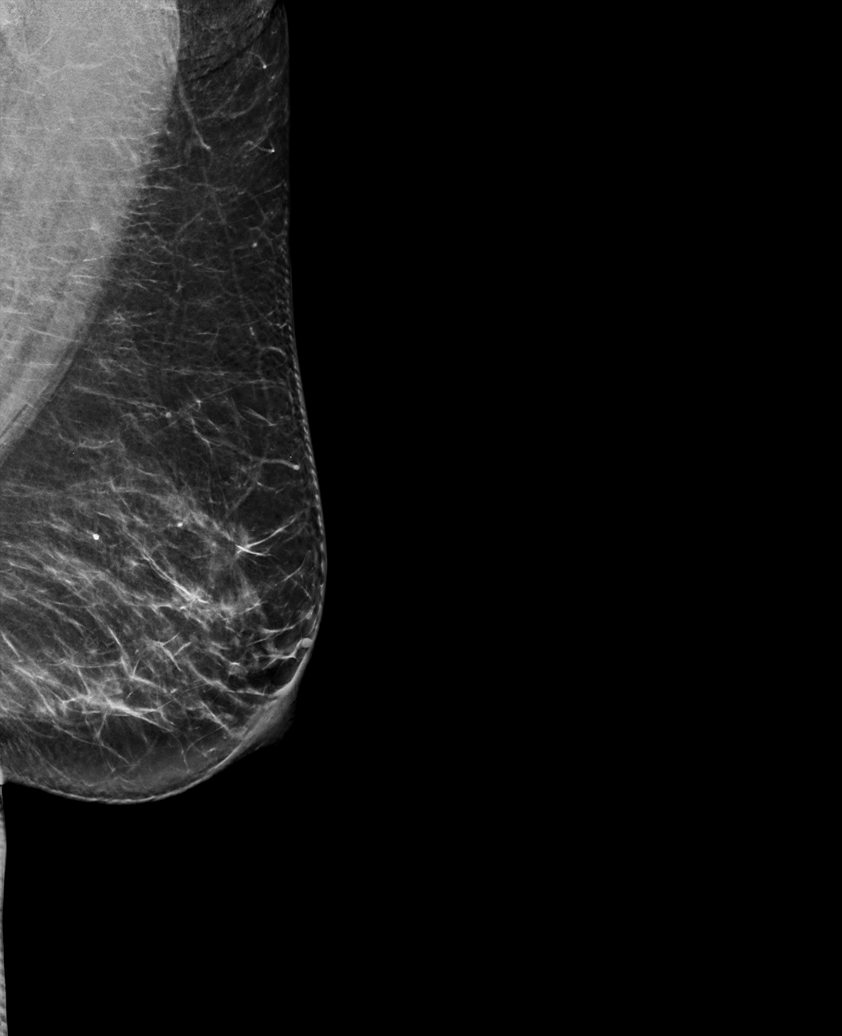

[R CC tomo · tomo slice 31/62.0]
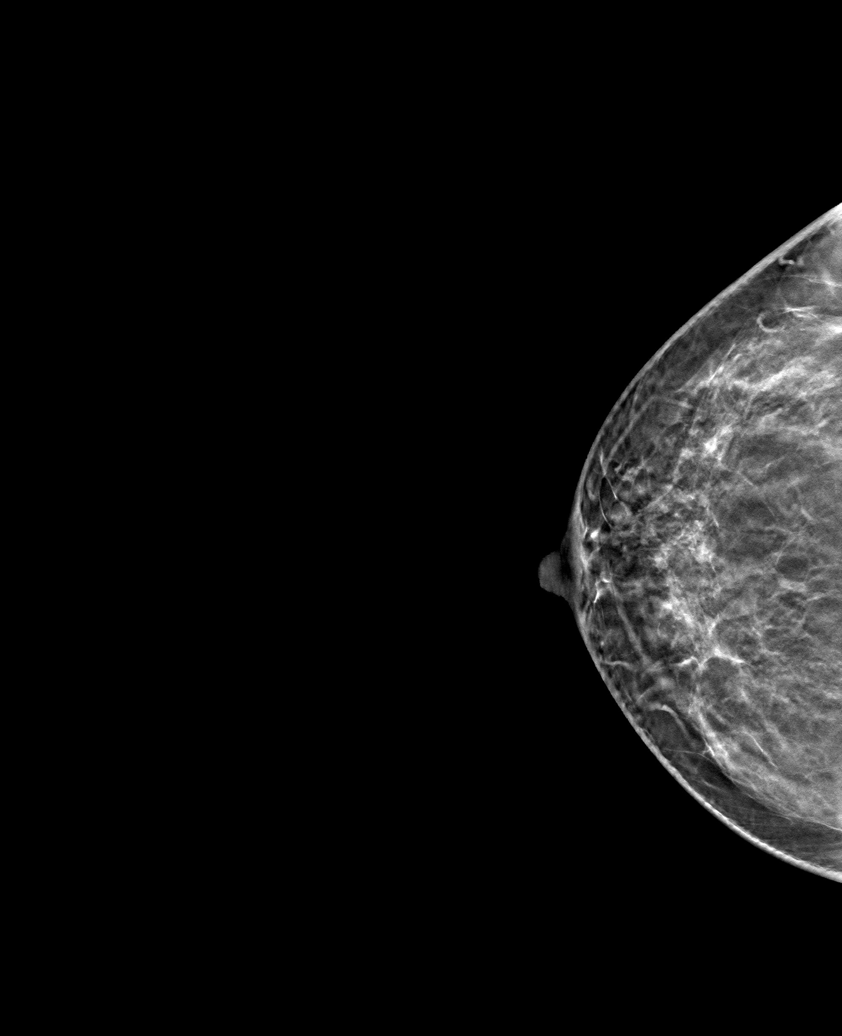

[6 of 30 positions shown; findings below may reference images not displayed]

ACR Breast Density Category b: There are scattered areas of
fibroglandular density.
FINDINGS: In the left breast, a possible mass warrants further evaluation. In
the right breast, no findings suspicious for malignancy.
IMPRESSION: Further evaluation is suggested for possible mass in the left
breast.

RECOMMENDATION:
Ultrasound of the left breast. (Code:[id number])

The patient will be contacted regarding the findings, and additional
imaging will be scheduled.

BI-RADS CATEGORY  0: Incomplete. Need additional imaging evaluation
and/or prior mammograms for comparison.

## 2023-05-08 ENCOUNTER — Other Ambulatory Visit: Payer: Self-pay | Admitting: Obstetrics and Gynecology

## 2023-05-08 DIAGNOSIS — Z1231 Encounter for screening mammogram for malignant neoplasm of breast: Secondary | ICD-10-CM

## 2023-05-09 ENCOUNTER — Ambulatory Visit: Payer: Self-pay | Admitting: Internal Medicine

## 2023-07-05 ENCOUNTER — Ambulatory Visit
Admission: RE | Admit: 2023-07-05 | Discharge: 2023-07-05 | Disposition: A | Discharge status: Home / Self Care | Payer: Self-pay | Source: Ambulatory Visit | Attending: Obstetrics and Gynecology | Admitting: Obstetrics and Gynecology

## 2023-07-05 ENCOUNTER — Ambulatory Visit: Payer: Self-pay | Admitting: *Deleted

## 2023-07-05 ENCOUNTER — Other Ambulatory Visit: Payer: Self-pay

## 2023-07-05 VITALS — BP 116/72 | Wt 154.0 lb

## 2023-07-05 DIAGNOSIS — Z1231 Encounter for screening mammogram for malignant neoplasm of breast: Secondary | ICD-10-CM

## 2023-07-05 DIAGNOSIS — Z1239 Encounter for other screening for malignant neoplasm of breast: Secondary | ICD-10-CM

## 2023-07-05 DIAGNOSIS — Z1211 Encounter for screening for malignant neoplasm of colon: Secondary | ICD-10-CM

## 2023-07-05 NOTE — Progress Notes (Signed)
 Ms. Leafy Motsinger is a 53 y.o. female who presents to [facility] clinic today with no complaints.    Pap Smear: Pap smear not completed today. Last Pap smear was 03/27/2022 at [facility] clinic and was normal with negative HPV. Per patient has history of one abnormal Pap smear 11/26/2020 that was ASCUS with negative HPV that a repeat Pap smear that a follow up Pap smear was completed in year that was normal. Last Pap smear result is available in Epic.   Physical exam: Breasts Left breast larger than right breast that per patient is normal for her. No skin abnormalities bilateral breasts. No nipple retraction bilateral breasts. No nipple discharge bilateral breasts. No lymphadenopathy. No lumps palpated bilateral breasts. No complaints of pain or tenderness on exam.       MS DIGITAL SCREENING TOMO BILATERAL Result Date: 02/23/2022 CLINICAL DATA:  Screening. History of LEFT breast cyst. EXAM: DIGITAL SCREENING BILATERAL MAMMOGRAM WITH TOMOSYNTHESIS AND CAD TECHNIQUE: Bilateral screening digital craniocaudal and mediolateral oblique mammograms were obtained. Bilateral screening digital breast tomosynthesis was performed. The images were evaluated with computer-aided detection. COMPARISON:  Previous exam(s). ACR Breast Density Category b: There are scattered areas of fibroglandular density. FINDINGS: There are no findings suspicious for malignancy. IMPRESSION: No mammographic evidence of malignancy. A result letter of this screening mammogram will be mailed directly to the patient. RECOMMENDATION: Screening mammogram in one year. (Code:[id number]) BI-RADS CATEGORY  1: Negative. Electronically Signed   By: Bary Richard M.D.   On: 02/23/2022 12:20   MM 3D SCREEN BREAST BILATERAL Result Date: 02/03/2021 CLINICAL DATA:  Screening. EXAM: DIGITAL SCREENING BILATERAL MAMMOGRAM WITH TOMOSYNTHESIS AND CAD TECHNIQUE: Bilateral screening digital craniocaudal and mediolateral oblique mammograms were obtained. Bilateral  screening digital breast tomosynthesis was performed. The images were evaluated with computer-aided detection. COMPARISON:  None. ACR Breast Density Category b: There are scattered areas of fibroglandular density. FINDINGS: In the left breast, a possible mass warrants further evaluation. In the right breast, no findings suspicious for malignancy. IMPRESSION: Further evaluation is suggested for possible mass in the left breast. RECOMMENDATION: Ultrasound of the left breast. (Code:US-L-60M) The patient will be contacted regarding the findings, and additional imaging will be scheduled. BI-RADS CATEGORY  0: Incomplete. Need additional imaging evaluation and/or prior mammograms for comparison. Electronically Signed   By: Sherian Rein M.D.   On: 02/03/2021 13:10    Pelvic/Bimanual Pap is not indicated today per BCCCP guidelines.   Smoking History: Patient has never smoked.   Patient Navigation: Patient education provided. Access to services provided for patient through [place] program. Spanish interpreter Natale Lay from [facility] provided.   Colorectal Cancer Screening: Per patient has never had colonoscopy completed. Patient completed a FIT Test 06/06/2021 that was negative. FIT Test given to patient today to complete. No complaints today.   Breast and Cervical Cancer Risk Assessment: Patient does not have family history of breast cancer, known genetic mutations, or radiation treatment to the chest before age 16. Patient does not have history of cervical dysplasia, immunocompromised, or DES exposure in-utero.  Risk Scores as of Encounter on 07/05/2023     Dondra Spry           5-year 0.84%   Lifetime 7.91%            Last calculated by Caprice Red, CMA on 07/05/2023 at  8:35 AM        A: BCCCP exam without pap smear No complaints.   P: Referred patient to the [facility]  for a screening mammogram on mobile unit. Appointment scheduled Thursday, July 05, 2023 at 0930.    Priscille Heidelberg, RN 07/05/2023 9:00 AM

## 2023-07-05 NOTE — Patient Instructions (Signed)
 Explained breast self awareness with Roberta Reed. Patient did not need a Pap smear today due to last Pap smear and HPV typing was 03/27/2022. Let her know BCCCP will cover Pap smears and HPV typing every 5 years unless has a history of abnormal Pap smears. Referred patient to the [facility] of [facility] for a screening mammogram on mobile unit. Appointment scheduled Thursday, July 05, 2023 at 0930. Patient aware of appointment and will be there. Let patient know the Breast Center will follow up with her within the next couple weeks with results of her mammogram by letter or phone. Roberta Char Mitzel verbalized understanding.  Keneshia Tena, Kathaleen Maser, RN 9:00 AM
